# Patient Record
Sex: Female | Born: 1970
Health system: Southern US, Community
[De-identification: ages and names within clinical notes are randomized; demographics above are authoritative.]

## PROBLEM LIST (undated history)

## (undated) ENCOUNTER — Encounter

## (undated) ENCOUNTER — Telehealth

## (undated) ENCOUNTER — Ambulatory Visit: Payer: PRIVATE HEALTH INSURANCE

## (undated) ENCOUNTER — Encounter: Attending: Rheumatology | Primary: Rheumatology

## (undated) ENCOUNTER — Ambulatory Visit

## (undated) ENCOUNTER — Ambulatory Visit: Payer: PRIVATE HEALTH INSURANCE | Attending: Family | Primary: Family

## (undated) MED ORDER — GLUCOSAMINE SULFATE 500 MG TABLET: ORAL | 0 days

## (undated) MED ORDER — OMEGA 3-DHA-EPA-FISH OIL 300 MG-1,000 MG CAPSULE: Freq: Every day | ORAL | 0 days

## (undated) MED ORDER — COENZYME Q10 10 MG CAPSULE: Freq: Every day | ORAL | 0.00000 days

---

## 2017-12-30 MED ORDER — FLUTICASONE 250 MCG-SALMETEROL 50 MCG/DOSE BLISTR POWDR FOR INHALATION
Freq: Two times a day (BID) | RESPIRATORY_TRACT | 4 refills | 0 days
Start: 2017-12-30 — End: 2018-08-19

## 2018-02-09 MED ORDER — FOLIC ACID 1 MG TABLET
ORAL_TABLET | Freq: Every day | ORAL | 1 refills | 0 days
Start: 2018-02-09 — End: 2018-08-19

## 2018-06-18 ENCOUNTER — Other Ambulatory Visit: Admit: 2018-06-18 | Discharge: 2018-06-19

## 2018-06-18 DIAGNOSIS — R69 Illness, unspecified: Principal | ICD-10-CM

## 2018-06-26 MED ORDER — SERTRALINE 50 MG TABLET
ORAL_TABLET | Freq: Every day | ORAL | 1 refills | 0.00000 days
Start: 2018-06-26 — End: 2018-08-19

## 2018-06-26 MED ORDER — ALBUTEROL SULFATE HFA 90 MCG/ACTUATION AEROSOL INHALER
RESPIRATORY_TRACT | 2 refills | 0 days
Start: 2018-06-26 — End: 2018-08-19

## 2018-07-28 MED ORDER — METHOTREXATE SODIUM 2.5 MG TABLET
ORAL_TABLET | ORAL | 0 refills | 0 days
Start: 2018-07-28 — End: 2018-08-19

## 2018-08-18 MED ORDER — MONTELUKAST 10 MG TABLET
ORAL_TABLET | Freq: Every day | ORAL | 1 refills | 0 days
Start: 2018-08-18 — End: 2018-08-19

## 2018-08-19 ENCOUNTER — Ambulatory Visit: Admit: 2018-08-19 | Discharge: 2018-08-19 | Payer: PRIVATE HEALTH INSURANCE

## 2018-08-19 DIAGNOSIS — M3130 Wegener's granulomatosis without renal involvement: Secondary | ICD-10-CM

## 2018-08-19 DIAGNOSIS — B9689 Other specified bacterial agents as the cause of diseases classified elsewhere: Secondary | ICD-10-CM

## 2018-08-19 DIAGNOSIS — R3915 Urgency of urination: Secondary | ICD-10-CM

## 2018-08-19 DIAGNOSIS — N760 Acute vaginitis: Secondary | ICD-10-CM

## 2018-08-19 DIAGNOSIS — K219 Gastro-esophageal reflux disease without esophagitis: Secondary | ICD-10-CM

## 2018-08-19 DIAGNOSIS — Z0000 Encounter for general adult medical examination without abnormal findings: Principal | ICD-10-CM

## 2018-08-19 DIAGNOSIS — F330 Major depressive disorder, recurrent, mild: Secondary | ICD-10-CM

## 2018-08-19 DIAGNOSIS — F419 Anxiety disorder, unspecified: Secondary | ICD-10-CM

## 2018-08-19 DIAGNOSIS — H905 Unspecified sensorineural hearing loss: Secondary | ICD-10-CM

## 2018-08-19 DIAGNOSIS — J4520 Mild intermittent asthma, uncomplicated: Secondary | ICD-10-CM

## 2018-08-19 LAB — AST (SGOT): Aspartate aminotransferase:CCnc:Pt:Ser/Plas:Qn:: 25

## 2018-08-19 LAB — COMPREHENSIVE METABOLIC PANEL
ALBUMIN: 3.4 g/dL (ref 3.4–5.0)
ALKALINE PHOSPHATASE: 65 U/L (ref 46–116)
ALT (SGPT): 15 U/L (ref 7–40)
ANION GAP: 8 mmol/L (ref 7–24)
AST (SGOT): 25 U/L (ref 13–40)
BILIRUBIN TOTAL: 0.6 mg/dL (ref 0.3–1.2)
BLOOD UREA NITROGEN: 18 mg/dL (ref 9–23)
BUN / CREAT RATIO: 33
CALCIUM: 8.7 mg/dL (ref 8.7–10.4)
CHLORIDE: 104 mmol/L (ref 98–107)
CO2: 26.9 mmol/L (ref 20.0–31.0)
CREATININE: 0.54 mg/dL (ref 0.50–0.80)
EGFR CKD-EPI AA FEMALE: >90 mL/min/{1.73_m2} (ref >=60)
GLUCOSE RANDOM: 89 mg/dL (ref 70–99)
POTASSIUM: 4.5 mmol/L (ref 3.5–5.1)
PROTEIN TOTAL: 7.3 g/dL (ref 5.7–8.2)
SODIUM: 139 mmol/L (ref 136–145)

## 2018-08-19 LAB — CBC
HEMOGLOBIN: 12.8 g/dL (ref 12.0–15.5)
MEAN CORPUSCULAR HEMOGLOBIN CONC: 33.6 g/dL (ref 30.0–36.0)
MEAN CORPUSCULAR HEMOGLOBIN: 29.7 pg (ref 26.0–34.0)
MEAN CORPUSCULAR VOLUME: 88.3 fL (ref 82.0–98.0)
MEAN PLATELET VOLUME: 7.3 fL (ref 7.0–10.0)
PLATELET COUNT: 308 10*9/L (ref 150–450)
RED CELL DISTRIBUTION WIDTH: 17.3 % — ABNORMAL HIGH (ref 12.0–15.0)
WBC ADJUSTED: 7.7 10*9/L (ref 3.5–10.5)

## 2018-08-19 LAB — LIPID PANEL
CHOLESTEROL/HDL RATIO SCREEN: 2.8 (ref 1.0–4.5)
CHOLESTEROL: 252 mg/dL — ABNORMAL HIGH (ref <=200)
NON-HDL CHOLESTEROL: 162 mg/dL — ABNORMAL HIGH (ref 70–130)
TRIGLYCERIDES: 111 mg/dL (ref 0–150)
VLDL CHOLESTEROL CAL: 22.2 mg/dL (ref 9–37)

## 2018-08-19 LAB — HEMATOCRIT: Lab: 38.2

## 2018-08-19 LAB — TRIGLYCERIDES: Triglyceride:MCnc:Pt:Ser/Plas:Qn:: 111

## 2018-08-19 LAB — THYROID STIMULATING HORMONE: Chemistry studies:Cmplx:-:^Patient:Set:: 1.124

## 2018-08-19 MED ORDER — MONTELUKAST 10 MG TABLET
ORAL_TABLET | Freq: Every day | ORAL | 1 refills | 0 days | Status: CP
Start: 2018-08-19 — End: ?
  Filled 2018-08-20: qty 90, 90d supply, fill #0

## 2018-08-19 MED ORDER — ALBUTEROL SULFATE HFA 90 MCG/ACTUATION AEROSOL INHALER
RESPIRATORY_TRACT | 3 refills | 0 days | Status: CP
Start: 2018-08-19 — End: ?
  Filled 2018-10-26: qty 8.5, 25d supply, fill #0

## 2018-08-19 MED ORDER — FLUCONAZOLE 150 MG TABLET
ORAL_TABLET | Freq: Once | ORAL | 0 refills | 0 days | Status: CP
Start: 2018-08-19 — End: 2018-09-03
  Filled 2018-08-20: qty 2, 2d supply, fill #0

## 2018-08-19 MED ORDER — PHENAZOPYRIDINE 200 MG TABLET
ORAL_TABLET | Freq: Three times a day (TID) | ORAL | 0 refills | 0.00000 days | Status: CP | PRN
Start: 2018-08-19 — End: 2018-09-03
  Filled 2018-08-20: qty 15, 5d supply, fill #0

## 2018-08-19 MED ORDER — PANTOPRAZOLE 40 MG TABLET,DELAYED RELEASE
ORAL_TABLET | Freq: Every day | ORAL | 1 refills | 90 days | Status: CP
Start: 2018-08-19 — End: ?
  Filled 2018-08-21: qty 30, 30d supply, fill #0

## 2018-08-19 MED ORDER — FOLIC ACID 1 MG TABLET
ORAL_TABLET | Freq: Every day | ORAL | 1 refills | 90.00000 days | Status: CP
Start: 2018-08-19 — End: 2019-02-04
  Filled 2018-08-20: qty 90, 90d supply, fill #0

## 2018-08-19 MED ORDER — FLUTICASONE 250 MCG-SALMETEROL 50 MCG/DOSE BLISTR POWDR FOR INHALATION
Freq: Two times a day (BID) | RESPIRATORY_TRACT | 3 refills | 0 days | Status: CP
Start: 2018-08-19 — End: ?
  Filled 2018-10-26: qty 60, 30d supply, fill #0

## 2018-08-19 MED ORDER — METHOTREXATE SODIUM 2.5 MG TABLET
ORAL_TABLET | ORAL | 1 refills | 0 days | Status: CP
Start: 2018-08-19 — End: 2019-02-04
  Filled 2018-08-20: qty 96, 84d supply, fill #0

## 2018-08-19 MED ORDER — SERTRALINE 50 MG TABLET
ORAL_TABLET | Freq: Every day | ORAL | 1 refills | 0 days | Status: CP
Start: 2018-08-19 — End: 2018-12-14
  Filled 2018-08-20: qty 270, 90d supply, fill #0

## 2018-08-19 MED ORDER — METRONIDAZOLE 0.75 % VAGINAL GEL
0 refills | 0 days | Status: CP
Start: 2018-08-19 — End: ?
  Filled 2018-08-19: qty 70, 7d supply, fill #0

## 2018-08-19 MED FILL — METRONIDAZOLE 0.75 % VAGINAL GEL: 7 days supply | Qty: 70 | Fill #0 | Status: AC

## 2018-08-19 NOTE — Unmapped (Signed)
Assessment and Plan:     Routine general medical examination at a health care facility.  Age-appropriate counseling was performed today. Discussed healthy diet and regular physical activity. Immunizations up to date. Discussed all health maintenance topics today. Mammogram order placed.  Pap updated today. Appropriate referrals placed. F/u 1 year for next CPE. Will check screening labs as below and call patient with results and update treatment plan as needed.   - CBC; Future  - Comprehensive Metabolic Panel; Future  - Lipid Panel; Future  - TSH; Future    Urinary urgency/ Bacterial vaginosis.  Urinary dipstick negative for WBC, blood, nitrites. Will send urine for culture to confirm.  Wet prep in the office was positive for clue cells. KOH positive for yeast. Prescription for Metrogel one application nightly x7 nights as well as diflucan as below. Will start Diflucan 150 mg once then repeat in 3 days.  Recommended pyridium 200mg  tid x 5 days to help calm down inflammation in the bladder. Patient to increase fluids. Recommended daily probiotic and to increase fluid intake. Patient verbalized understanding. F/u if no improvement in 3-5 days.   - POCT urinalysis dipstick  - Urine culture; Future  - POCT KOH Exam  - POCT Wet Prep  - fluconazole (DIFLUCAN) 150 MG tablet. Take 1 tablet (150 mg total) by mouth once for 1 dose today and another one in 3 days.,  - metroNIDAZOLE (METROGEL) 0.75 % vaginal gel; Insert one applicator vaginally at night for 7 nights.    Anxiety/ Mild episode of recurrent major depressive disorder (CMS-HCC).  Depression at goal with present therapy of Zoloft 150 mg daily.  PHQ-9 at zero.  GAD-7 at zero. At this time, there is no need for referral to additional depression care-management support.  Continue current treatment plan.  Patient verbalized an understanding of today's recommendations. Refills given. F/u 6 months, sooner with any concerns.   - sertraline (ZOLOFT) 50 MG tablet; Take 3 tablets (150 mg total) by mouth daily.    Mild intermittent asthma without complication.  Symptoms well-controlled at present with daily Singulair and Advair inhalers. Patient has albuterol HFA inhaler for flares.  Patient states heat/summertime is when she struggles with asthma exacerbations.  Continue current treatment plan.  Refills given today.   - montelukast (SINGULAIR) 10 mg tablet; TAKE 1 TABLET BY MOUTH EVERY DAY  - fluticasone propion-salmeterol (ADVAIR DISKUS) 250-50 mcg/dose diskus; Inhale 1 puff Two (2) times a day.  - albuterol HFA 90 mcg/actuation inhaler; Inhale 2 puffs Every four (4) hours.    Gastroesophageal reflux disease, esophagitis presence not specified.  Symptoms well controlled on Protonix.  Continue current treatment plan.  Refills given today.    - pantoprazole (PROTONIX) 40 MG tablet; Take 1 tablet (40 mg total) by mouth daily.    Granulomatosis with polyangiitis, unspecified whether renal involvement (CMS-HCC).  Symptoms stable on methotrexate daily along with prednisone taper as needed.  Patient's previous rheumatologist is retiring and patient request new referral today; information for referral is placed on AVS. Continue current treatment plan.   - Ambulatory referral to Rheumatology; Future  - folic acid (FOLVITE) 1 MG tablet; Take 1 tablet (1 mg total) by mouth daily.  - methotrexate 2.5 MG tablet; Take 8 tablets (20 mg total) by mouth every seven (7) days.    Sensorineural hearing loss (SNHL) of right ear, unspecified hearing status on contralateral side.  Referral to ENT placed today; recommendations written on AVS.  - Ambulatory referral to ENT; Future  Medication adherence and barriers to the treatment plan have been addressed. Opportunities to optimize healthy behaviors have been discussed. Patient / caregiver voiced understanding.    Return in about 6 months (around 02/17/2019) for Recheck, Depression, Anxiety, Hyperlipidemia.    Subjective:   Alexis Jenkins is a 48 y.o. female who presents to establish care with chief complaint of urinary urgency.  Patient states she ran a urine dipstick last Thursday and it only showed trace blood at work.  Patient denies burning. Patient states it is more like a raking feeling when she urinates.  Patient states symptoms started last Wednesday.  Patient has had bladder issues since giving childbirth.  Patient has pain in her lower back and legs.  Patient denies vaginal discharge. Patient denies vaginal odor.  Patient denies pain with intercourse.      Past medical history significant for anxiety (Zoloft 150 mg), reflux (Protonix 40 mg daily), asthma (Advair daily, albuterol HFA inhaler, Singulair), and Wegener's (methorexate with folate and prednisone prn) and some dyslipidemia.     Past surgical history:  Partial hysterectomy with ovaries and cervix intact. Carpal tunnel release. Septum repair with rhinoplasty. Breast augmentation surgery.     Asthma:  Patient states this time of year her asthma is well controlled.  Patient is on Advair daily and has albuterol HFA as needed.  Patient states the warm weather/heat is her biggest trigger for asthma.      Anxiety:  PHQ-9 at zero. GAD-7 at zero. Patient states her mood is stable and has been able to decrease her Zoloft to 150 mg daily.  She denies current suicidal and homicidal ideation. She complains of the following side effects from the treatment: none.    Reflux:  Patient states she struggles with reflux symptoms and takes Protonix 40 mg on a daily basis.  Patient states her weight is up a bit and that has exacerbated her reflux symptoms.       Family History:  Positive for diabetes, hypertension, depression, anxiety disorder.   Daughter has CMT and myasthenia gravis.  Maternal grandmother with Alzheimer's. Maternal grandfather positive for lung and colon cancer.  Sister with anxiety and depression.      Allergies: Erythromycin (nausea or vomiting, nitrofurantoin (nausea or vomiting), Xanax (hypersensitivity).     Social History:  Cigarettes/smokeless tobacco: Never used.  Alcohol:  Social a couple of drinks per week.  Illicit drugs: Never used.    Health maintenance:  Tdap: up to date; due 07/01/2022.  Mammogram: Order placed today.  Last physical 11/2017. Pneumonia completed.  Last Pap 11/2017 at Dr. Tawanna Cooler Meissinger at  [facility],  [facility] under patient name Jamani Eley.      Lifestyle:  Diet:  Could be better.   Physical activity:  Recently joined the wellness center.   Sleep: 7-8 hours sound sleep.   Dental and Eye exams: Up to date.   Hearing:  Recommend referral to ENT.    Chief Complaint   Patient presents with   ??? Establish Care     w/ physical, urgency of holding water, ran UA last thursday and it only showed blood at work, raking feeling, not burning   ??? Other     last PAP was in June 2019 normal, repeat every 3 years     Social History:     Social History     Socioeconomic History   ??? Marital status: Married     Spouse name: Morton Peters Julien Girt)   ??? Number of children:  2   ??? Years of education: None   ??? Highest education level: None   Occupational History   ??? Occupation: Lab at [employer]   Social Needs   ??? Financial resource strain: None   ??? Food insecurity     Worry: None     Inability: None   ??? Transportation needs     Medical: None     Non-medical: None   Tobacco Use   ??? Smoking status: Never Smoker   ??? Smokeless tobacco: Never Used   Substance and Sexual Activity   ??? Alcohol use: Yes     Alcohol/week: 1.0 standard drinks     Types: 1 Glasses of wine per week     Frequency: 2-3 times a week     Drinks per session: 1 or 2   ??? Drug use: Never   ??? Sexual activity: Yes     Partners: Male     Birth control/protection: None   Lifestyle   ??? Physical activity     Days per week: None     Minutes per session: None   ??? Stress: None   Relationships   ??? Social Wellsite geologist on phone: None     Gets together: None     Attends religious service: None     Active member of club or organization: None     Attends meetings of clubs or organizations: None     Relationship status: None   Other Topics Concern   ??? None   Social History Narrative   ??? None     married    Past Medical/Surgical History:     Past Medical History:   Diagnosis Date   ??? Anxiety    ??? Asthma    ??? Depression    ??? GERD (gastroesophageal reflux disease)    ??? Hyperlipidemia      Past Surgical History:   Procedure Laterality Date   ??? breast lift  2010   ??? BREAST SURGERY      augmentation   ??? button in nose for septum  2019   ??? DNC  1991 & 2004   ??? HAND SURGERY  2011   ??? PARTIAL HYSTERECTOMY  2012   ??? RHINOPLASTY  2005       Family History:     Family History   Problem Relation Age of Onset   ??? Diabetes Mother         type 2   ??? Hypertension Mother    ??? Depression Mother    ??? Anxiety disorder Mother    ??? Hyperlipidemia Mother    ??? No Known Problems Father    ??? Anxiety disorder Sister    ??? Depression Sister    ??? Charcot-Marie-Tooth disease Daughter    ??? Myasthenia gravis Daughter    ??? Alzheimer's disease Maternal Grandmother    ??? Heart disease Maternal Grandfather    ??? Cancer Maternal Grandfather         lung cancer (smoker). colon cancer   ??? No Known Problems Paternal Grandmother    ??? No Known Problems Paternal Grandfather    ??? Substance Abuse Disorder Neg Hx        Allergies:     Erythromycin base; Nitrofurantoin macrocrystal; and Xanax [alprazolam]    Current Medications:     Current Outpatient Medications   Medication Sig Dispense Refill   ??? albuterol HFA 90 mcg/actuation inhaler Inhale 2 puffs Every four (4)  hours. 8.5 g 3   ??? fluticasone propion-salmeterol (ADVAIR DISKUS) 250-50 mcg/dose diskus Inhale 1 puff Two (2) times a day. 180 each 3   ??? folic acid (FOLVITE) 1 MG tablet Take 1 tablet (1 mg total) by mouth daily. 90 tablet 1   ??? methotrexate 2.5 MG tablet Take 8 tablets (20 mg total) by mouth every seven (7) days. 160 tablet 1   ??? montelukast (SINGULAIR) 10 mg tablet TAKE 1 TABLET BY MOUTH EVERY DAY 90 tablet 1   ??? pantoprazole (PROTONIX) 40 MG tablet Take 1 tablet (40 mg total) by mouth daily. 90 tablet 1   ??? predniSONE (DELTASONE) 5 MG tablet Take 5 mg by mouth once as needed.     ??? sertraline (ZOLOFT) 50 MG tablet Take 3 tablets (150 mg total) by mouth daily. 270 tablet 1     No current facility-administered medications for this visit.        Health Maintenance:       PREVENTIVE SERVICES SUMMARY:    -     -  Colorectal Cancer Screening:    Colonoscopy date: Not Found - Patient is low risk; Screening starts at 50 years  -  Breast Cancer Screening:    Mammogram date: 05/25/2014 - Discussed at this visit; order placed today.   -  Cervical Cancer Screening:    Pap Smear date: Not Found    Done at this visit  -  Mammogram: ordered  -  Pap: done at this visit.     Immunizations:     Immunization History   Administered Date(s) Administered   ??? INFLUENZA TIV (TRI) 66MO+ W/ PRESERV (IM) 04/01/2015   ??? Influenza Vaccine Quad (IIV4 PF) 25mo+ injectable 05/01/2014, 03/17/2015   ??? Influenza Virus Vaccine, unspecified formulation 03/23/2016, 03/28/2017, 03/22/2018   ??? PNEUMOCOCCAL POLYSACCHARIDE 23 07/03/2014   ??? Pneumococcal Conjugate 13-Valent 12/12/2017   ??? TdaP 07/01/2012   ??? Tetanus and diptheria,(adult), adsorbed, 2Lf tetanus toxoid, PF 03/27/2005       I have reviewed and (if needed) updated the patient's problem list, medications, allergies, past medical and surgical history, preventive services, and social and family history.    ROS:     A 12 point review of systems was negative except for pertinent items noted in the HPI.    Vital Signs:     Wt Readings from Last 3 Encounters:   08/19/18 76.1 kg (167 lb 12.8 oz)     Temp Readings from Last 3 Encounters:   08/19/18 36.8 ??C (98.3 ??F)     BP Readings from Last 3 Encounters:   08/19/18 110/76     Pulse Readings from Last 3 Encounters:   08/19/18 83     Estimated body mass index is 29.73 kg/m?? as calculated from the following:    Height as of this encounter: 160 cm (5' 2.99). Weight as of this encounter: 76.1 kg (167 lb 12.8 oz).  Facility age limit for growth percentiles is 20 years.        Objective:     General Appearance: Alert, cooperative, no distress, appears stated age.   Head and Neck: Normocephalic, atraumatic. No  masses. No thyromegaly. No bruits.   EENT: Eyes: PERRLA, Conjuntiva clear; Ears: bilateral TMs normal; Nose: septum and turbinates unremarkable; Throat: mucus membranes moist. No tonsilar enlargement or exudates. No OP erythema.  Cardiovascular:  Regular rate and rhythm. No murmurs  Respiratory: Lungs clear to auscultation bilaterally,  Respiratory effort unremarkable. No wheezes or rhonchi.  Breast: Symmetrical. No masses or lumps. No nipple discharge.  No abnormal axillary nodes  Gastrointestinal: Abdomen soft and non-tender. No organomegaly or masses.  Genitourinary: Normal female external genitalia. Urethra non-tender and without masses, lesions. Vagina normal in appearance, without discharge, lesions. No significant prolapse. Cervix os identified without discharge. Uterus mobile, normal in size and non-tender. Bimanual exam revealed no masses or cervical motion tenderness. Pap obtained.  Vaginal swabs for KOH and wet prep obtained.  Musculoskeletal: Gait and station unremarkable. Normal ROM. Normal strength and tone.   Extremities :No edema.  Peripheral pulses normal  Integumentary: Warm and dry.No rashes.  No abnormal appearing skin lesions  LYMPH NODES: Cervical, supraclavicular, and axillary nodes normal  Neurologic: Alert and oriented to place and time. Normal deep tendon reflexes. CN II-XII grossly intact.   Psychiatric: Mood and affect appropriate for situation.     I attest that I, Geralyn Mora Appl, personally documented this note while acting as scribe for [employer], [employer].      Geralyn A Mazzotta, Scribe.  08/19/2018     The documentation recorded by the scribe accurately reflects the service I personally performed and the decisions made by me. Gabriel Rainwater, PA

## 2018-08-19 NOTE — Unmapped (Signed)
[facility] Specialty Medication Referral: Financial Assistance Unavailable      Medication (Brand/Generic): Methotrexate    Final Test Claim completed with resulted information below:    Patient ABLE to fill at [facility]  Insurance Company:  AHP  Anticipated Copay: $68.43  Is anticipated copay with a copay card or grant? No  Co-Pay Card program: No Co-Pay card available   Kennedy Bucker Funding: No grant available   MFR Assistance: No program available      Does patient's insurance plan only allow a 15 day supply for the first 6 fills in the [facility] Program? No  If yes, inform patient they can request to dis-enroll from the [facility] by calling the patient help desk at N/A.      If the copay is under the $25 defined limit, per policy there will be no further investigation of need for financial assistance at this time unless patient requests. This referral has been communicated to the provider and handed off to the [facility] team for further processing and filling of prescribed medication.   ______________________________________________________________________  Please utilize this referral for viewing purposes as it will serve as the central location for all relevant documentation and updates.

## 2018-08-19 NOTE — Unmapped (Signed)
Addended by: Threasa Beards on: 08/19/2018 10:32 AM     Modules accepted: Orders

## 2018-08-19 NOTE — Unmapped (Addendum)
error 

## 2018-08-19 NOTE — Unmapped (Addendum)
Per test claims for Methotrexate and Metronidazole at the [facility], patient needs Medication Assistance Program for High Copays.    Per test claim for Pantoprazole at the [facility], patient needs Medication Assistance Program for Prior Authorization.

## 2018-08-19 NOTE — Unmapped (Addendum)
Patient Education      ENT REFERRAL:  [facility] WITH DR. Natale Milch AND DR. Yetta Barre.     RHEUMATOLOGY REFERRAL:  [facility] OFF OF [address], [place].      Urge Incontinence in Women: Care Instructions  Your Care Instructions    Urge incontinence occurs when the need to urinate is so strong that you cannot reach the toilet in time, even when your bladder contains only a small amount of urine. This is also called overactive bladder or unstable bladder. Some women may have no warning before they leak urine. This condition does not cause major health problems. But it can be embarrassing and can affect a woman's self-esteem and confidence.  Treatment can cure or improve your symptoms.  Follow-up care is a key part of your treatment and safety. Be sure to make and go to all appointments, and call your doctor if you are having problems. It's also a good idea to know your test results and keep a list of the medicines you take.  How can you care for yourself at home?  ?? Be safe with medicines. Take your medicines exactly as prescribed. Call your doctor if you think you are having a problem with your medicine. You will get more details on the specific medicines your doctor prescribes.  ?? Limit caffeine and alcohol. They stimulate urine production.  ?? Urinate every 2 to 4 hours during waking hours, even if you feel that you do not have to go.  ?? Do pelvic floor (Kegel) exercises, which tighten and strengthen pelvic muscles. To do Kegel exercises:  ? Squeeze the same muscles you would use to stop your urine. Your belly and thighs should not move.  ? Hold the squeeze for 3 seconds, then relax for 3 seconds.  ? Start with 3 seconds. Then add 1 second each week until you are able to squeeze for 10 seconds.  ? Repeat the exercise 10 to 15 times for each session. Do three or more sessions each day.  ?? Try wearing pads that absorb the leaks.  ?? Keep skin in the genital area dry.  When should you call for help?  Call your doctor now or seek immediate medical care if:  ?? ?? You have new urinary symptoms. These may include leaking urine, having pain when urinating, or feeling like you need to urinate often.   ??Watch closely for changes in your health, and be sure to contact your doctor if:  ?? ?? You do not get better as expected.   Where can you learn more?  Go to [facility] at https://myuncchart.org  [facility] [employer] [facility] under [employer]. Enter [phone] in the search box to learn more about Urge Incontinence in Women: Care Instructions.  Current as of: August 19, 2017  Content Version: 12.3  ?? 2006-2019 [employer] Inc[employer]. Care instructions adapted under license by [facility]. If you have questions about a medical condition or this instruction, always ask your healthcare professional. Healthwise, Incorporated disclaims any warranty or liability for your use of this information.       Patient Education        Well Visit, Ages 36 to 73: Care Instructions  Your Care Instructions    Physical exams can help you stay healthy. Your doctor has checked your overall health and may have suggested ways to take good care of yourself. He or she also may have recommended tests. At home, you can help prevent illness with healthy eating, regular  exercise, and other steps.  Follow-up care is a key part of your treatment and safety. Be sure to make and go to all appointments, and call your doctor if you are having problems. It's also a good idea to know your test results and keep a list of the medicines you take.  How can you care for yourself at home?  ?? Reach and stay at a healthy weight. This will lower your risk for many problems, such as obesity, diabetes, heart disease, and high blood pressure.  ?? Get at least 30 minutes of physical activity on most days of the week. Walking is a good choice. You also may want to do other activities, such as running, swimming, cycling, or playing tennis or team sports. Discuss any changes in your exercise program with your doctor.  ?? Do not smoke or allow others to smoke around you. If you need help quitting, talk to your doctor about stop-smoking programs and medicines. These can increase your chances of quitting for good.  ?? Talk to your doctor about whether you have any risk factors for sexually transmitted infections (STIs). Having one sex partner (who does not have STIs and does not have sex with anyone else) is a good way to avoid these infections.  ?? Use birth control if you do not want to have children at this time. Talk with your doctor about the choices available and what might be best for you.  ?? Protect your skin from too much sun. When you're outdoors from 10 a.m. to 4 p.m., stay in the shade or cover up with clothing and a hat with a wide brim. Wear sunglasses that block UV rays. Even when it's cloudy, put broad-spectrum sunscreen (SPF 30 or higher) on any exposed skin.  ?? See a dentist one or two times a year for checkups and to have your teeth cleaned.  ?? Wear a seat belt in the car.  Follow your doctor's advice about when to have certain tests. These tests can spot problems early.  For everyone  ?? Cholesterol. Have the fat (cholesterol) in your blood tested after age 58. Your doctor will tell you how often to have this done based on your age, family history, or other things that can increase your risk for heart disease.  ?? Blood pressure. Have your blood pressure checked during a routine doctor visit. Your doctor will tell you how often to check your blood pressure based on your age, your blood pressure results, and other factors.  ?? Vision. Talk with your doctor about how often to have a glaucoma test.  ?? Diabetes. Ask your doctor whether you should have tests for diabetes.  ?? Colon cancer. Your risk for colorectal cancer gets higher as you get older. Some experts say that adults should start regular screening at age 43 and stop at age 4. Others say to start before age 68 or continue after age 34. Talk with your doctor about your risk and when to start and stop screening.  For women  ?? Breast exam and mammogram. Talk to your doctor about when you should have a clinical breast exam and a mammogram. Medical experts differ on whether and how often women under 50 should have these tests. Your doctor can help you decide what is right for you.  ?? Cervical cancer screening test and pelvic exam. Begin with a Pap test at age 9. The test often is part of a pelvic exam. Starting at age 33, you may choose  to have a Pap test, an HPV test, or both tests at the same time (called co-testing). Talk with your doctor about how often to have testing.  ?? Tests for sexually transmitted infections (STIs). Ask whether you should have tests for STIs. You may be at risk if you have sex with more than one person, especially if your partners do not wear condoms.  For men  ?? Tests for sexually transmitted infections (STIs). Ask whether you should have tests for STIs. You may be at risk if you have sex with more than one person, especially if you do not wear a condom.  ?? Testicular cancer exam. Ask your doctor whether you should check your testicles regularly.  ?? Prostate exam. Talk to your doctor about whether you should have a blood test (called a PSA test) for prostate cancer. Experts differ on whether and when men should have this test. Some experts suggest it if you are older than 56 and are African-American or have a father or brother who got prostate cancer when he was younger than 36.  When should you call for help?  Watch closely for changes in your health, and be sure to contact your doctor if you have any problems or symptoms that concern you.  Where can you learn more?  Go to [facility] at https://myuncchart.org  [employer] Health Library under [employer]. Enter P072 in the search box to learn more about Well Visit, Ages 59 to 54: Care Instructions.  Current as of: February 18, 2018  Content Version: 12.3  ?? 2006-2019 Healthwise, Inc[employer]. Care instructions adapted under license by [facility]. If you have questions about a medical condition or this instruction, always ask your healthcare professional. Healthwise, Incorporated disclaims any warranty or liability for your use of this information.

## 2018-08-20 MED FILL — METHOTREXATE SODIUM 2.5 MG TABLET: 84 days supply | Qty: 96 | Fill #0 | Status: AC

## 2018-08-20 MED FILL — FLUCONAZOLE 150 MG TABLET: 2 days supply | Qty: 2 | Fill #0 | Status: AC

## 2018-08-20 MED FILL — FOLIC ACID 1 MG TABLET: 90 days supply | Qty: 90 | Fill #0 | Status: AC

## 2018-08-20 MED FILL — PHENAZOPYRIDINE 200 MG TABLET: 5 days supply | Qty: 15 | Fill #0 | Status: AC

## 2018-08-20 MED FILL — MONTELUKAST 10 MG TABLET: 90 days supply | Qty: 90 | Fill #0 | Status: AC

## 2018-08-20 MED FILL — SERTRALINE 50 MG TABLET: 90 days supply | Qty: 270 | Fill #0 | Status: AC

## 2018-08-20 NOTE — Unmapped (Signed)
Called patient left a detailed vm about the   Pantoprazole. She can purchase it over the counter for about $20 for 90 days.

## 2018-08-20 NOTE — Unmapped (Signed)
-----   Message from [facility] [place], [place] sent at 08/20/2018  9:44 AM EST -----  Can you please look into this for me?  ----- Message -----  From: Arlan Organ, PharmD  Sent: 08/20/2018   9:22 AM EST  To: Ian Bushman, PA    Anya,  Emmily's pantoprazole requires a prior authorization. I wasn't sure if anybody reached out to you already from the shared service center. If not, you can find the form for pantoprazole (and any other medications requiring prior authorization under the [facility] plan) at [email or web address]    Thanks!    [facility] at [facility]

## 2018-08-21 MED FILL — PANTOPRAZOLE 40 MG TABLET,DELAYED RELEASE: 30 days supply | Qty: 30 | Fill #0 | Status: AC

## 2018-09-03 ENCOUNTER — Ambulatory Visit: Admit: 2018-09-03 | Discharge: 2018-09-04 | Payer: PRIVATE HEALTH INSURANCE

## 2018-09-03 DIAGNOSIS — F329 Major depressive disorder, single episode, unspecified: Principal | ICD-10-CM

## 2018-09-03 DIAGNOSIS — K219 Gastro-esophageal reflux disease without esophagitis: Principal | ICD-10-CM

## 2018-09-03 DIAGNOSIS — J45909 Unspecified asthma, uncomplicated: Principal | ICD-10-CM

## 2018-09-03 DIAGNOSIS — E785 Hyperlipidemia, unspecified: Principal | ICD-10-CM

## 2018-09-03 DIAGNOSIS — H905 Unspecified sensorineural hearing loss: Principal | ICD-10-CM

## 2018-09-03 DIAGNOSIS — J329 Chronic sinusitis, unspecified: Principal | ICD-10-CM

## 2018-09-03 DIAGNOSIS — J3489 Other specified disorders of nose and nasal sinuses: Principal | ICD-10-CM

## 2018-09-03 DIAGNOSIS — F419 Anxiety disorder, unspecified: Principal | ICD-10-CM

## 2018-09-04 NOTE — Unmapped (Signed)
Assessment/Plan:        Diagnoses and all orders for this visit:    Asymmetrical sensorineural hearing loss  AUDIOMETRIC TESTING: Audiometric testing was performed today and revealed a bilateral Sensorineural hearing loss worse on the left with intact speech discrimination scores. These findings were reviewed with the patient/parent.  Consider hearing aids.  The patient will sign a release that we can get her records from her prior evaluation and compare her current audiogram to previous.  Nasal septal perforation    Chronic sinusitis, unspecified location  Consider removal of the septal button since she has not had any issues with epistaxis in a considerable amount of time.          Subjective:      HPI  Alexis Jenkins is a 48 y.o. old female who presents for evaluation of hearing loss.  She has had issues primarily in the left ear for years.  She underwent an evaluation at [facility].  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  It began after an ear infection in 2015.  It has become increasingly difficult for her to hear and understand conversation particularly in background noise.  She has frequent sinus infections.  She has been treated multiple times since July.  She had a septal button placed for perforation years ago.    The following portions of the patient's history were reviewed and updated as appropriate:   She  has a past medical history of Anxiety, Asthma, Depression, GERD (gastroesophageal reflux disease), and Hyperlipidemia.  She  has a past surgical history that includes [facility] (1991 & 2004); Hand surgery (2011); Rhinoplasty (2005); breast lift (2010); Partial hysterectomy (2012); button in nose for septum (2019); and Breast surgery.  Her family history includes Alzheimer's disease in her maternal grandmother; Anxiety disorder in her mother and sister; Cancer in her maternal grandfather; Charcot-Marie-Tooth disease in her daughter; Depression in her mother and sister; Diabetes in her mother; Heart disease in her maternal grandfather; Hyperlipidemia in her mother; Hypertension in her mother; Myasthenia gravis in her daughter; No Known Problems in her father, paternal grandfather, and paternal grandmother.  She  reports that she has never smoked. She has never used smokeless tobacco. She reports current alcohol use of about 1.0 standard drinks of alcohol per week. She reports that she does not use drugs.  Current Outpatient Medications   Medication Sig Dispense Refill   ??? albuterol HFA 90 mcg/actuation inhaler Inhale 2 puffs Every four (4) hours. 8.5 g 3   ??? fluticasone propion-salmeterol (ADVAIR DISKUS) 250-50 mcg/dose diskus Inhale 1 puff Two (2) times a day. 180 each 3   ??? folic acid (FOLVITE) 1 MG tablet Take 1 tablet (1 mg total) by mouth daily. 90 tablet 1   ??? methotrexate 2.5 MG tablet Take 8 tablets (20 mg total) by mouth every seven (7) days. 160 tablet 1   ??? metroNIDAZOLE (METROGEL) 0.75 % vaginal gel Insert one applicator vaginally at night for 7 nights. 70 g 0   ??? montelukast (SINGULAIR) 10 mg tablet TAKE 1 TABLET BY MOUTH EVERY DAY 90 tablet 1   ??? pantoprazole (PROTONIX) 40 MG tablet Take 1 tablet (40 mg total) by mouth daily. 90 tablet 1   ??? sertraline (ZOLOFT) 50 MG tablet Take 3 tablets (150 mg total) by mouth daily. 270 tablet 1     No current facility-administered medications for this visit.      She is allergic to erythromycin base; nitrofurantoin macrocrystal;  and xanax [alprazolam]..    Review of Systems  ENT ROS as noted in HPI  Fever--NO  Skin rash--NO  Chest pain--NO  Nausea/vomiting--NO  Easy bleeding--NO  Eating/swallowing problems--NO  Weight changes--NO  Headache--yes  Ear drainage--NO  Shortness of breath--NO  Cough--NO  Urinary problems--NO    Objective:      Physical Exam  Constitutional      General appearance: Appears stated age, normocephalic and atraumatic. Vocalization and voice quality normal for age.    Ears, Nose, Mouth, and Throat External inspection of ears: Normal overall appearance without lesions or inflammation bilaterally.     External inspection of the nose: symmetric and atraumatic.     Otoscopic examination: External auditory canal without inflammation, discharge, lesions,or masses bilaterally. Middle Ear without effusion, mass, or cholesteatoma bilaterally. Tympanic membranes intact without retractions or lesions bilaterally.     Hearing:  Clinical speech reception within normal limits.    Nasal mucosa: Normal pink color without polyps identified bilaterally. Septum: Septal button in nasal septal perforation with moderate crusting adjacent. Turbinates: Inferior - normal size and color bilaterally.     Oral Cavity:  Lips, teeth, and gums: Upper and Lower lips with normal color, moist and no cracks or lesions. Dentition in good repair. Gingiva: normal color without lesions.   Hard Palate: intact without clefting and no lesions. Soft Palate: Intact, normal mucosal color. Tongue: tongue has normal mobility, color and appearance. Oral mucosa: buccal, labial, lingual, palatal mucosa pink, well hydrated without lesions or masses. Oropharynx: pharyngeal mucosa without inflamation, without lesions or masses. Tonsils: tonsils not enlarged or inflamed bilaterally.Tongue base/lingual tonsils are without inflammation or masses. Floor of mouth: normal course of Wharton's duct, no masses.      Submandibular Glands: normal bilaterally. Parotid Glands: normal size, symmetric, no lesions palpated bilaterally. Sublingual salivary glands: normal.          Neck     Neck: Normal appearance without obvious scarring or asymmetry.   Thyroid: Thyroid not enlarged, no palpable nodules or tenderness.     Lymphatic     Palpation of lymph nodes in head and neck: Left: Normal. Right: Normal.     Neurologic   Cranial nerves: Cranial nerves intact with no focal neurological deficits. Normal ocular motility, primary gaze and normal facial strength.     Psychiatric Judgment and insight: Normal for age.   Alert and oriented as appropriate for age.      Mood and affect: Normal for age.             I attest to the above information and documentation. However, this note has been created using voice recognition software and may have errors that were not dictated and not seen in editing.

## 2018-09-04 NOTE — Unmapped (Signed)
AUDIOLOGIC EVALUATION    Joud Pettinato presents to the [facility] audiology department for an evaluation. She is seen in conjunction with Dr. Natale Milch.      HISTORY: Shainna Faux is a 48 y.o. female who presents for evaluation of hearing loss. She reports a hearing loss in her left ear for more than 5 years, and she was told it is due to Wegener's disease.    Otoscopy revealed clear ear canals bilaterally. Tympanometry was administered today to assess middle ear status. Results are as indicated below for today???s immittance testing.  BILATERAL: Type A, consistent with normal middle ear pressure, width, and admittance    RESULTS: Today's results were obtained using inserts with good reliability.   PURE-TONE FINDINGS  RIGHT ear: Mild sensorineural hearing loss.  LEFT ear: Mild sloping to moderately-severe sensorineural hearing loss.    Speech reception thresholds are in agreement with pure-tone findings, bilaterally.     RIGHT ear: Word recognition is excellent (100%) when speech is presented at a average conversational level (55 dB).   LEFT ear: Word recognition is good (88%) when speech is presented at a loud level (70 dB) with masking.       * * * SEE MEDIA FOR FULL AUDIOGRAM * * *    RECOMMENDATIONS:  ??? ENT - patient is seeing Dr. Natale Milch today.  ??? Annual hearing evaluation  ??? Further testing per ENT to investigate left asymmetrical hearing loss.  ??? Consider trial with amplification pending medical clearance    Deidre Ala, Au.D., CCC-A  Clinical Audiologist

## 2018-09-25 MED FILL — PANTOPRAZOLE 40 MG TABLET,DELAYED RELEASE: ORAL | 30 days supply | Qty: 30 | Fill #1

## 2018-09-25 MED FILL — PANTOPRAZOLE 40 MG TABLET,DELAYED RELEASE: 30 days supply | Qty: 30 | Fill #1 | Status: AC

## 2018-09-25 NOTE — Unmapped (Signed)
Patient will be scheduled with Casimiro Needle today for UTI as she has not established with our practice.

## 2018-10-26 MED FILL — ADVAIR DISKUS 250 MCG-50 MCG/DOSE POWDER FOR INHALATION: 30 days supply | Qty: 60 | Fill #0 | Status: AC

## 2018-10-26 MED FILL — PROAIR HFA 90 MCG/ACTUATION AEROSOL INHALER: 25 days supply | Qty: 8 | Fill #0 | Status: AC

## 2018-11-13 MED FILL — PANTOPRAZOLE 40 MG TABLET,DELAYED RELEASE: ORAL | 90 days supply | Qty: 90 | Fill #0

## 2018-11-13 MED FILL — PANTOPRAZOLE 40 MG TABLET,DELAYED RELEASE: 90 days supply | Qty: 90 | Fill #0 | Status: AC

## 2018-11-13 MED FILL — METHOTREXATE SODIUM 2.5 MG TABLET: 90 days supply | Qty: 110 | Fill #0 | Status: AC

## 2018-11-13 MED FILL — METHOTREXATE SODIUM 2.5 MG TABLET: ORAL | 90 days supply | Qty: 110 | Fill #0

## 2018-12-03 MED FILL — MONTELUKAST 10 MG TABLET: 90 days supply | Qty: 90 | Fill #0 | Status: AC

## 2018-12-03 MED FILL — MONTELUKAST 10 MG TABLET: ORAL | 90 days supply | Qty: 90 | Fill #0

## 2018-12-15 MED ORDER — SERTRALINE 50 MG TABLET
ORAL_TABLET | Freq: Every day | ORAL | 1 refills | 0 days | Status: CP
Start: 2018-12-15 — End: ?
  Filled 2018-12-18: qty 270, 90d supply, fill #0

## 2018-12-15 NOTE — Unmapped (Signed)
Requested refill sent to listed pharmacy.

## 2018-12-15 NOTE — Unmapped (Signed)
Patient is requesting the following refill  Requested Prescriptions     Pending Prescriptions Disp Refills   ??? sertraline (ZOLOFT) 50 MG tablet 270 tablet 1     Sig: Take 3 tablets (150 mg total) by mouth daily.       Last refill given on: 08-19-18 with 270 count and 0 refills.     Last OV: 08/19/2018, Appt was type was annual exam  Next OV: 02/17/2019. Scheduled for follow up appointment.

## 2018-12-18 MED FILL — SERTRALINE 50 MG TABLET: 90 days supply | Qty: 270 | Fill #0 | Status: AC

## 2019-01-12 MED FILL — FOLIC ACID 1 MG TABLET: 90 days supply | Qty: 90 | Fill #1 | Status: AC

## 2019-01-12 MED FILL — FOLIC ACID 1 MG TABLET: ORAL | 90 days supply | Qty: 90 | Fill #1

## 2019-02-04 ENCOUNTER — Ambulatory Visit: Admit: 2019-02-04 | Discharge: 2019-02-05 | Payer: PRIVATE HEALTH INSURANCE

## 2019-02-04 ENCOUNTER — Telehealth
Admit: 2019-02-04 | Discharge: 2019-02-05 | Payer: PRIVATE HEALTH INSURANCE | Attending: Rheumatology | Primary: Rheumatology

## 2019-02-04 DIAGNOSIS — M2550 Pain in unspecified joint: Secondary | ICD-10-CM

## 2019-02-04 DIAGNOSIS — M3130 Wegener's granulomatosis without renal involvement: Secondary | ICD-10-CM

## 2019-02-04 LAB — CBC W/ AUTO DIFF
BASOPHILS ABSOLUTE COUNT: 0 10*9/L (ref 0.0–0.1)
BASOPHILS RELATIVE PERCENT: 0.4 %
EOSINOPHILS ABSOLUTE COUNT: 0.2 10*9/L (ref 0.0–0.7)
EOSINOPHILS RELATIVE PERCENT: 2.7 %
HEMATOCRIT: 35.7 % (ref 35.0–44.0)
HEMOGLOBIN: 12.2 g/dL (ref 12.0–15.5)
LYMPHOCYTES ABSOLUTE COUNT: 1.6 10*9/L (ref 0.7–4.0)
LYMPHOCYTES RELATIVE PERCENT: 24 %
MEAN CORPUSCULAR VOLUME: 89.9 fL (ref 82.0–98.0)
MEAN PLATELET VOLUME: 6.9 fL — ABNORMAL LOW (ref 7.0–10.0)
MONOCYTES ABSOLUTE COUNT: 0.4 10*9/L (ref 0.1–1.0)
MONOCYTES RELATIVE PERCENT: 5.9 %
NEUTROPHILS RELATIVE PERCENT: 67 %
PLATELET COUNT: 261 10*9/L (ref 150–450)
RED BLOOD CELL COUNT: 3.97 10*12/L (ref 3.90–5.03)
RED CELL DISTRIBUTION WIDTH: 16.2 % — ABNORMAL HIGH (ref 12.0–15.0)
WBC ADJUSTED: 6.7 10*9/L (ref 3.5–10.5)

## 2019-02-04 LAB — COMPREHENSIVE METABOLIC PANEL
ALBUMIN: 3.7 g/dL (ref 3.4–5.0)
ALKALINE PHOSPHATASE: 66 U/L (ref 46–116)
ALT (SGPT): 10 U/L (ref 10–49)
AST (SGOT): 23 U/L (ref <34)
BILIRUBIN TOTAL: 0.8 mg/dL (ref 0.3–1.2)
BLOOD UREA NITROGEN: 11 mg/dL (ref 9–23)
BUN / CREAT RATIO: 21
CALCIUM: 9.1 mg/dL (ref 8.7–10.4)
CO2: 23 mmol/L (ref 20.0–31.0)
CREATININE: 0.53 mg/dL (ref 0.50–0.80)
EGFR CKD-EPI AA FEMALE: >90 mL/min/{1.73_m2}
EGFR CKD-EPI NON-AA FEMALE: >90 mL/min/{1.73_m2}
POTASSIUM: 4.1 mmol/L (ref 3.5–5.1)
PROTEIN TOTAL: 6.3 g/dL (ref 5.7–8.2)
SODIUM: 139 mmol/L (ref 136–145)

## 2019-02-04 LAB — MONOCYTES RELATIVE PERCENT: Lab: 5.9

## 2019-02-04 LAB — EGFR CKD-EPI AA FEMALE: Lab: >90

## 2019-02-04 MED ORDER — FOLIC ACID 1 MG TABLET
ORAL_TABLET | Freq: Every day | ORAL | 3 refills | 90 days | Status: CP
Start: 2019-02-04 — End: ?

## 2019-02-04 MED ORDER — METHOTREXATE SODIUM 2.5 MG TABLET
ORAL_TABLET | ORAL | 3 refills | 84.00000 days | Status: CP
Start: 2019-02-04 — End: ?
  Filled 2019-02-19: qty 96, 84d supply, fill #0

## 2019-02-04 NOTE — Unmapped (Signed)
RHEUMATOLOGY NEW CLINIC NOTE--VIDEO    Assessment/Plan:   Alexis Jenkins is a Caucasian  48 y.o. female with limited GPA (+ANA, -ANCA), characterized by hearing loss, nose bleed 2/2 septum perforation, and asthma.    Granulomatosis with polyangiitis, unspecified whether renal involvement (CMS-HCC) (M31.30)     Limited Granulomatosis with polyangiitis (+ANA, -ANCA):  Diagnosed 2013. Symptoms includes nose bleed from nasal septum perforation, hearing loss, and asthma. Currently on MTX 20 mg PO with good control of disease activity. For nasal symptoms, she is followed by ENT for treatment options.  - Continue MTX 20 mg PO weekly and folic acid (refill ordered).  - MTX labs (CBC, ALT, AST, Cr). Last labs 08/2018 were wnl.  - UA at least annually, recently normal    Hearing loss:  No hearing function on the L and some impairment on the R. She is followed by ENT to discuss placement of hearing aids.    Arthralgia:  Joint pain involving wrist, knee, feet, hips over the past 3 years. Symptoms sound more inflammatory in nature (prolonged morning stiffness, swelling). If indeed inflammatory arthritis, this could be from GPA, and MTX would be a first line for treating arthritis symptoms. Since she is on PO MTX, could consider switching to injection to increase bioavailability.     The hip pain could also be from OA, trochanteric bursitis, or less likely avascular necrosis (unlikely given limited prednisone use). Will obtain x-ray to characterize the joint disease.   - x-ray of hand, feet, and hips to be done at [facility]  - Consider switching MTX from PO to [facility] if evidence of inflammatory arthritis on imaging or if sx are worsening    Watery diarrhea:  Reports intermittent episodes of watery diarrhea that has been a chronic issue and not related to MTX. Most likely IBS. Will monitor symptoms, and if symptoms seem to be worsen by MTX, will switch from PO to [place] to bypass GI.    Orders Placed This Encounter   Procedures   ??? XR Hand 2 Views Bilateral   ??? XR Foot 3 Or More Views Bilateral   ??? XR Pelvis 1 Or 2 Views   ??? CBC w/ Differential   ??? Comprehensive Metabolic Panel       Medications Prescribed Today             folic acid (FOLVITE) 1 MG tablet Take 1 tablet (1 mg total) by mouth daily.    methotrexate 2.5 MG tablet Take 8 tablets (20 mg total) by mouth every seven (7) days.           Return in about 6 months (around 08/07/2019).    We appreciate the opportunity to participate in the care of this patient. Total duration of the visit was 32 minutes , and > than 50% of the visit is spent in face-to-face counselling focusing on diagnosis and therapeutic options.       History of Present Illness:     The patient was seen to establish care for limited GPA following the retirement of her prior rheumatologist (Dr. Freida Busman at [facility]).    Primary Care Provider: Gabriel Rainwater, PA    Chief Complaint: establish care for GPA on MTX    HPI:  Alexis Jenkins is a 48 y.o.  female with hx of limited GPA (+ANA, -ANCA), characterized by hearing loss, nose bleed 2/2 septum perforation, and asthma who was referred to [facility] rheumatology as a new patient. She was previously followed by Dr.  Freida Busman at [facility], who recently retired.    The patient was initially diagnosed with GPA in 2013. She initially presented with frequent sinus infections, hearing loss, nasal septum perforation, rashes, and asthma attack. There was no kidney involvement. Since being diagnosed, she has been taking MTX 20 mg weekly without side effects. She was on prednisone previously, but discontinued due to side effects including weight loss, sleep issue, and irritability. Over the past couple years, her GPA is well controlled and rarely flares up, maybe a few times a year. The flares are triggered by stress or other illness. The last flare was in 05/2018, with symptoms of nose bleeding, sinusitis, and rash (hands and face). The flares usually gets better with prednisone. She also reports occasional pain oral sores in the mouth.    Regarding hearing loss, patient reports that she continues to have no hearing function on the left. She has some hearing impairment on the right. She has been working with ENT on possibly getting hearing aids.    She experiences daily nose bleeding from the nasal septum perforation, 2-3 times per day. It bleeds profusely for a short while but is easy to stop. ENT previously put a button in the hole of the nasal septum in 12/2017, which worked well in terms of stopping bleeding, but the button frequently dislodged into the sinus. Therefore, they removed the button, and patient does not want another one treatment at this moment. She has been followed by ENT regarding her nasal septum and sinusitis issues. She uses neti pot once in a while for symptom relief. She denies anemia issue from recurrent bleeding and reports her Hgb was fine. She has not had any recent infection since 05/2018.    Regarding asthma, she uses Singulair and Advair with good control of asthma symptoms. She uses albuterol occasional, more often in the summer. She would need albuterol for a few days, and then not using it for months.    Patient endorses joint pain in wrist, knee, hips, feet for the past 3 years. The pain is worse in the morning. Morning stiffness lasts for couple hours, which is relieved by ibuprofen. She reports some swelling in ankles and wrist. She reports the hip pain is new and located in the hip socket. The hip pain is especially worse when sleeping.    She endorses watery diarrhea on some days, 4-5 times a day on the days of having the diarrhea. She reports this has been a life-long issue and not related to initiating MTX. Rarely seen mucus in the stool. No bloody, tarry stool, or tenesmus.    Allergies:  Erythromycin base, Nitrofurantoin macrocrystal, and Xanax [alprazolam]    Medications:   Outpatient Medications Prior to Visit   Medication Sig Dispense Refill   ??? albuterol HFA 90 mcg/actuation inhaler Inhale 2 puffs Every four (4) hours. 8.5 g 3   ??? fluticasone propion-salmeteroL (ADVAIR DISKUS) 250-50 mcg/dose diskus Inhale 1 puff Two (2) times a day. 180 each 3   ??? metroNIDAZOLE (METROGEL) 0.75 % vaginal gel Insert one applicator vaginally at night for 7 nights. 70 g 0   ??? montelukast (SINGULAIR) 10 mg tablet TAKE 1 TABLET BY MOUTH EVERY DAY 90 tablet 1   ??? pantoprazole (PROTONIX) 40 MG tablet Take 1 tablet (40 mg total) by mouth daily. 90 tablet 1   ??? sertraline (ZOLOFT) 50 MG tablet Take 3 tablets (150 mg total) by mouth daily. 270 tablet 1   ??? folic acid (FOLVITE) 1 MG  tablet Take 1 tablet (1 mg total) by mouth daily. 90 tablet 1   ??? methotrexate 2.5 MG tablet Take 8 tablets (20 mg total) by mouth every seven (7) days. 160 tablet 1     No facility-administered medications prior to visit.        Medical History:  Past Medical History:   Diagnosis Date   ??? Anxiety    ??? Asthma    ??? Depression    ??? GERD (gastroesophageal reflux disease)    ??? Hyperlipidemia        Surgical History:  Past Surgical History:   Procedure Laterality Date   ??? breast lift  2010   ??? BREAST SURGERY      augmentation   ??? button in nose for septum  2019   ??? DNC  1991 & 2004   ??? HAND SURGERY  2011   ??? PARTIAL HYSTERECTOMY  2012   ??? RHINOPLASTY  2005       Social History:  Social History     Tobacco Use   ??? Smoking status: Never Smoker   ??? Smokeless tobacco: Never Used   Substance Use Topics   ??? Alcohol use: Yes     Alcohol/week: 1.0 standard drinks     Types: 1 Glasses of wine per week     Frequency: 2-3 times a week     Drinks per session: 1 or 2   ??? Drug use: Never   She is a Holiday representative. Lives with husband and daughter, and a dog. EtOH occasional, denies smoking or drug.    Family History:  Family History   Problem Relation Age of Onset   ??? Diabetes Mother         type 2   ??? Hypertension Mother    ??? Depression Mother    ??? Anxiety disorder Mother    ??? Hyperlipidemia Mother    ??? No Known Problems Father    ??? Anxiety disorder Sister    ??? Depression Sister    ??? Charcot-Marie-Tooth disease Daughter    ??? Myasthenia gravis Daughter    ??? Alzheimer's disease Maternal Grandmother    ??? Heart disease Maternal Grandfather    ??? Cancer Maternal Grandfather         lung cancer (smoker). colon cancer   ??? No Known Problems Paternal Grandmother    ??? No Known Problems Paternal Grandfather    ??? Substance Abuse Disorder Neg Hx        Review of Systems: Please see above in the HPI, the remainder of the ROS was unremarkable. Denies new rash, SOB, chest pain, cough, hemoptysis, hematuria, foamy, abadominal pain, fever, chills, headache, or vision change.    Review of outside records: I have reviewed available records through Epic. Pertinent results include:   ANCA negative (12/12/2017)  ANA 1:640 (11/30/2013)      Objective   Physical Exam (limited 2/2 video modality)  General:   Well appearing female in no acute distress   Eyes:   PERRL, EOMI, conjunctivae are clear.   ENT:   MMM. Oropharhynx without any erythema or exudate.   Neck:   Supple, full ROM   Lungs:   Normal work of breathing.   Skin:    No rash.   Psychiatry:   Alert and oriented to person, place, and time   Musculo Skeletal:   No joint tenderness, deformity, effusions. Full range of motion in shoulder, elbow, hands .  Test Results  No visits with results within 4 Week(s) from this visit.   Latest known visit with results is:   Appointment on 08/19/2018   Component Date Value   ??? WBC 08/19/2018 7.7    ??? RBC 08/19/2018 4.33    ??? HGB 08/19/2018 12.8    ??? HCT 08/19/2018 38.2    ??? MCV 08/19/2018 88.3    ??? [facility] [place] [facility] 08/19/2018 29.7    ??? MCHC 08/19/2018 33.6    ??? RDW 08/19/2018 17.3*   ??? MPV 08/19/2018 7.3    ??? Platelet 08/19/2018 308    ??? Sodium 08/19/2018 139    ??? Potassium 08/19/2018 4.5    ??? Chloride 08/19/2018 104    ??? Anion Gap 08/19/2018 8    ??? CO2 08/19/2018 26.9    ??? BUN 08/19/2018 18    ??? Creatinine 08/19/2018 0.54    ??? BUN/Creatinine Ratio 08/19/2018 33    ??? EGFR CKD-EPI Non-African* 08/19/2018 >90    ??? EGFR CKD-EPI African Ame* 08/19/2018 >90    ??? Glucose 08/19/2018 89    ??? Calcium 08/19/2018 8.7    ??? Albumin 08/19/2018 3.4    ??? Total Protein 08/19/2018 7.3    ??? Total Bilirubin 08/19/2018 0.6    ??? AST 08/19/2018 25    ??? ALT 08/19/2018 15    ??? Alkaline Phosphatase 08/19/2018 65    ??? Triglycerides 08/19/2018 111    ??? Cholesterol 08/19/2018 252*   ??? HDL 08/19/2018 90*   ??? LDL Calculated 08/19/2018 161*   ??? VLDL Cholesterol Cal 08/19/2018 22.2    ??? Chol/HDL Ratio 08/19/2018 2.8    ??? Non-HDL Cholesterol 08/19/2018 162*   ??? FASTING 08/19/2018 Yes    ??? TSH 08/19/2018 1.124      Kaidan Spengler Danelle Earthly, MS4

## 2019-02-12 ENCOUNTER — Ambulatory Visit: Admit: 2019-02-12 | Discharge: 2019-02-13 | Payer: PRIVATE HEALTH INSURANCE

## 2019-02-12 DIAGNOSIS — M3130 Wegener's granulomatosis without renal involvement: Principal | ICD-10-CM

## 2019-02-12 DIAGNOSIS — M2550 Pain in unspecified joint: Secondary | ICD-10-CM

## 2019-02-14 ENCOUNTER — Ambulatory Visit: Admit: 2019-02-14 | Discharge: 2019-02-15 | Payer: PRIVATE HEALTH INSURANCE

## 2019-02-19 MED FILL — PANTOPRAZOLE 40 MG TABLET,DELAYED RELEASE: ORAL | 30 days supply | Qty: 30 | Fill #0

## 2019-02-19 MED FILL — PANTOPRAZOLE 40 MG TABLET,DELAYED RELEASE: 30 days supply | Qty: 30 | Fill #0 | Status: AC

## 2019-02-19 MED FILL — METHOTREXATE SODIUM 2.5 MG TABLET: 84 days supply | Qty: 96 | Fill #0 | Status: AC

## 2019-03-04 ENCOUNTER — Ambulatory Visit: Admit: 2019-03-04 | Discharge: 2019-03-05 | Payer: PRIVATE HEALTH INSURANCE

## 2019-03-04 DIAGNOSIS — Z1239 Encounter for other screening for malignant neoplasm of breast: Secondary | ICD-10-CM

## 2019-03-05 ENCOUNTER — Ambulatory Visit: Admit: 2019-03-05 | Discharge: 2019-03-06 | Payer: PRIVATE HEALTH INSURANCE

## 2019-03-05 DIAGNOSIS — H1033 Unspecified acute conjunctivitis, bilateral: Secondary | ICD-10-CM

## 2019-03-05 MED ORDER — MOXIFLOXACIN 0.5 % VISCOUS EYE DROPS
0 refills | 0 days | Status: CP
Start: 2019-03-05 — End: ?
  Filled 2019-03-05: qty 3, 7d supply, fill #0

## 2019-03-05 MED FILL — MOXIFLOXACIN 0.5 % VISCOUS EYE DROPS: 7 days supply | Qty: 3 | Fill #0 | Status: AC

## 2019-03-05 NOTE — Unmapped (Signed)
Name:  Alexis Jenkins  DOB: 18-Jun-1971  Today's Date: 03/05/2019   Age:  48 y.o.    A/P:  Acute bacterial conjunctivitis of both eyes.  Prescription for moxifloxacin eye drops as below.  Instructed patient on the contagious nature of conjunctivitis and to avoid patient contact for the next 24 hours.  Keep towels separate and avoid touching eye to surfaces.  Counseled patient that she would no longer be contagious after being on eyedrops x24 hours.  Patient verbalized understanding and was in agreement with plan.    - moxifloxacin (MOXEZA) 0.5 % DrpV; Instill 1 drop in both eyes twice daily for 7 days.    Preventive services addressed today  Preventive services are currently up to date    Medication adherence and barriers to the treatment plan have been addressed. Opportunities to optimize healthy behaviors have been discussed. Patient / caregiver voiced understanding.    Return if symptoms worsen or fail to improve.      S:  Alexis Jenkins is a 48 y.o. female who presents with chief complaint of itchy eye, red eye, and swollen eyelid on the right. Patient has had drainage with eyes matted shut in the morning both today and yesterday.  Patient states her nose has been running all week. Symptoms started on Wednesday.     ROS:  A 12 point review of systems was negative except for pertinent items noted in the HPI.    Past medical history, family history, surgical history and social history personally reviewed and verified with patient.     PROBLEM LIST  Patient Active Problem List   Diagnosis   ??? Anxiety   ??? Asthma   ??? Depression   ??? GERD (gastroesophageal reflux disease)   ??? Granulomatosis with polyangiitis of nose (CMS-HCC)   ??? Wegener's disease, pulmonary (CMS-HCC)   ??? Nasal septal perforation   ??? Positive ANA (antinuclear antibody)   ??? Sensorineural hearing loss of right ear       O:  Vitals:    03/05/19 1148   BP: 116/86   Pulse: 86   Resp: 18   Temp: 36.6 ??C (97.9 ??F)   SpO2: 99%     General appearance: No acute distress, well appearing and well nourished.   HEENT: Normocephalic, atraumatic. Right eye with erythema, edema and purulent drainage. Conjunctiva normal, lids w/o swelling, erythema or discharge. TMs gray with intact cone of light. Nasal mucosa, septum, and turbinates w/o edema or erythema. OP normal with no erythema, edema, exudate or lesions.   NECK: Supple, symmetric, trachea midline, no masses. No lymphadenopathy. No thyromegaly or masses.  HEART: RRR w/ normal S1 and S2. No MRG. DP and PT pulses  2+ bilaterally. No peripheral edema or varicosities.  LUNGS: No signs of respiratory distress. CTA all fields. No wheezes, rales, rhonchi or crackles.     I attest that I, Geralyn Mora Appl, personally documented this note while acting as scribe for [employer], PA.      Geralyn A Mazzotta, Scribe.  03/05/2019     The documentation recorded by the scribe accurately reflects the service I personally performed and the decisions made by me.    Gabriel Rainwater, PA

## 2019-03-05 NOTE — Unmapped (Addendum)
Patient Education     Instructed patient on contagious nature. Once you are on antibiotics x 24 hours you will no longer be contagious.      Do not touch the dropper to your eye when giving yourself the eyedrops.    Pinkeye: Care Instructions  Your Care Instructions     Pinkeye is redness and swelling of the eye surface and the conjunctiva (the lining of the eyelid and the covering of the white part of the eye). Pinkeye is also called conjunctivitis. Pinkeye is often caused by infection with bacteria or a virus. Dry air, allergies, smoke, and chemicals are other common causes.  Pinkeye often clears on its own in 7 to 10 days. Antibiotics only help if the pinkeye is caused by bacteria. Pinkeye caused by infection spreads easily. If an allergy or chemical is causing pinkeye, it will not go away unless you can avoid whatever is causing it.  Follow-up care is a key part of your treatment and safety. Be sure to make and go to all appointments, and call your doctor if you are having problems. It's also a good idea to know your test results and keep a list of the medicines you take.  How can you care for yourself at home?  ?? Wash your hands often. Always wash them before and after you treat pinkeye or touch your eyes or face.  ?? Use moist cotton or a clean, wet cloth to remove crust. Wipe from the inside corner of the eye to the outside. Use a clean part of the cloth for each wipe.  ?? Put cold or warm wet cloths on your eye a few times a day if the eye hurts.  ?? Do not wear contact lenses or eye makeup until the pinkeye is gone. Throw away any eye makeup you were using when you got pinkeye. Clean your contacts and storage case. If you wear disposable contacts, use a new pair when your eye has cleared and it is safe to wear contacts again.  ?? If the doctor gave you antibiotic ointment or eyedrops, use them as directed. Use the medicine for as long as instructed, even if your eye starts looking better soon. Keep the bottle tip clean, and do not let it touch the eye area.  ?? To put in eyedrops or ointment:  ? Tilt your head back, and pull your lower eyelid down with one finger.  ? Drop or squirt the medicine inside the lower lid.  ? Close your eye for 30 to 60 seconds to let the drops or ointment move around.  ? Do not touch the ointment or dropper tip to your eyelashes or any other surface.  ?? Do not share towels, pillows, or washcloths while you have pinkeye.  When should you call for help?   Call your doctor now or seek immediate medical care if:  ?? ?? You have pain in your eye, not just irritation on the surface.   ?? ?? You have a change in vision or loss of vision.   ?? ?? You have an increase in discharge from the eye.   ?? ?? Your eye has not started to improve or begins to get worse within 48 hours after you start using antibiotics.   ?? ?? Pinkeye lasts longer than 7 days.   Watch closely for changes in your health, and be sure to contact your doctor if you have any problems.  Where can you learn more?  Go to [facility]  at https://myuncchart.org  Select Patient Education under [employer]. Enter Y392 in the search box to learn more about Pinkeye: Care Instructions.  Current as of: December 24, 2017??????????????????????????????Content Version: 12.6  ?? 2006-2020 [employer].   Care instructions adapted under license by [facility]. If you have questions about a medical condition or this instruction, always ask your healthcare professional. Healthwise, Incorporated disclaims any warranty or liability for your use of this information.

## 2019-03-09 ENCOUNTER — Ambulatory Visit: Admit: 2019-03-09 | Discharge: 2019-03-09 | Payer: PRIVATE HEALTH INSURANCE

## 2019-03-09 ENCOUNTER — Institutional Professional Consult (permissible substitution): Admit: 2019-03-09 | Discharge: 2019-03-09 | Payer: PRIVATE HEALTH INSURANCE

## 2019-03-09 DIAGNOSIS — E785 Hyperlipidemia, unspecified: Secondary | ICD-10-CM

## 2019-03-09 DIAGNOSIS — N912 Amenorrhea, unspecified: Secondary | ICD-10-CM

## 2019-03-09 DIAGNOSIS — R5383 Other fatigue: Secondary | ICD-10-CM

## 2019-03-09 DIAGNOSIS — K219 Gastro-esophageal reflux disease without esophagitis: Secondary | ICD-10-CM

## 2019-03-09 DIAGNOSIS — R635 Abnormal weight gain: Secondary | ICD-10-CM

## 2019-03-09 DIAGNOSIS — F330 Major depressive disorder, recurrent, mild: Secondary | ICD-10-CM

## 2019-03-09 DIAGNOSIS — F419 Anxiety disorder, unspecified: Secondary | ICD-10-CM

## 2019-03-09 DIAGNOSIS — J4520 Mild intermittent asthma, uncomplicated: Secondary | ICD-10-CM

## 2019-03-09 DIAGNOSIS — H1033 Unspecified acute conjunctivitis, bilateral: Secondary | ICD-10-CM

## 2019-03-09 LAB — THYROID STIMULATING HORMONE: Chemistry studies:Cmplx:-:^Patient:Set:: 1.373

## 2019-03-09 LAB — LIPID PANEL
CHOLESTEROL/HDL RATIO SCREEN: 3.4 (ref 1.0–4.5)
CHOLESTEROL: 255 mg/dL — ABNORMAL HIGH (ref <=200)
LDL CHOLESTEROL CALCULATED: 124 mg/dL — ABNORMAL HIGH (ref 40–100)
TRIGLYCERIDES: 281 mg/dL — ABNORMAL HIGH (ref 0–150)
VLDL CHOLESTEROL CAL: 56.2 mg/dL — ABNORMAL HIGH (ref 9–37)

## 2019-03-09 LAB — CBC W/ AUTO DIFF
BASOPHILS ABSOLUTE COUNT: 0 10*9/L (ref 0.0–0.1)
BASOPHILS RELATIVE PERCENT: 0.3 %
EOSINOPHILS ABSOLUTE COUNT: 0.3 10*9/L (ref 0.0–0.7)
HEMATOCRIT: 35.9 % (ref 35.0–44.0)
HEMOGLOBIN: 12.4 g/dL (ref 12.0–15.5)
LYMPHOCYTES ABSOLUTE COUNT: 1.8 10*9/L (ref 0.7–4.0)
LYMPHOCYTES RELATIVE PERCENT: 25.3 %
MEAN CORPUSCULAR HEMOGLOBIN CONC: 34.4 g/dL (ref 30.0–36.0)
MEAN CORPUSCULAR HEMOGLOBIN: 31.1 pg (ref 26.0–34.0)
MEAN PLATELET VOLUME: 6.8 fL — ABNORMAL LOW (ref 7.0–10.0)
MONOCYTES ABSOLUTE COUNT: 0.5 10*9/L (ref 0.1–1.0)
MONOCYTES RELATIVE PERCENT: 6.5 %
NEUTROPHILS ABSOLUTE COUNT: 4.7 10*9/L (ref 1.7–7.7)
NEUTROPHILS RELATIVE PERCENT: 64.2 %
PLATELET COUNT: 316 10*9/L (ref 150–450)
RED BLOOD CELL COUNT: 3.97 10*12/L (ref 3.90–5.03)
RED CELL DISTRIBUTION WIDTH: 15.8 % — ABNORMAL HIGH (ref 12.0–15.0)
WBC ADJUSTED: 7.2 10*9/L (ref 3.5–10.5)

## 2019-03-09 LAB — RED BLOOD CELL COUNT: Lab: 3.97

## 2019-03-09 LAB — COMPREHENSIVE METABOLIC PANEL
ALBUMIN: 3.5 g/dL (ref 3.4–5.0)
ALKALINE PHOSPHATASE: 71 U/L (ref 46–116)
ALT (SGPT): 16 U/L (ref 7–40)
ANION GAP: 8 mmol/L (ref 7–24)
AST (SGOT): 20 U/L (ref 13–40)
BILIRUBIN TOTAL: 0.6 mg/dL (ref 0.3–1.2)
BUN / CREAT RATIO: 18
CALCIUM: 8.6 mg/dL — ABNORMAL LOW (ref 8.7–10.4)
CHLORIDE: 103 mmol/L (ref 98–107)
CREATININE: 0.65 mg/dL (ref 0.50–0.80)
EGFR CKD-EPI AA FEMALE: >90 mL/min/{1.73_m2} (ref >=60)
EGFR CKD-EPI NON-AA FEMALE: >90 mL/min/{1.73_m2} (ref >=60)
GLUCOSE RANDOM: 82 mg/dL (ref 70–99)
POTASSIUM: 3.9 mmol/L (ref 3.5–5.1)
PROTEIN TOTAL: 7.1 g/dL (ref 5.7–8.2)

## 2019-03-09 LAB — VITAMIN B-12: Cobalamins:MCnc:Pt:Ser/Plas:Qn:: 618

## 2019-03-09 LAB — FERRITIN: Chemistry studies:Cmplx:-:^Patient:Set:: 17

## 2019-03-09 LAB — FOLLICLE STIMULATING HORMONE: Chemistry studies:Cmplx:-:^Patient:Set:: 8.8

## 2019-03-09 LAB — FREE T4: Chemistry studies:Cmplx:-:^Patient:Set:: 1.11

## 2019-03-09 LAB — ESTRADIOL LEVEL: Estradiol:MCnc:Pt:Ser/Plas:Qn:: 211.7

## 2019-03-09 LAB — T3 FREE: Chemistry studies:Cmplx:-:^Patient:Set:: 3.3

## 2019-03-09 LAB — GLUCOSE RANDOM: Glucose:MCnc:Pt:Ser/Plas:Qn:: 82

## 2019-03-09 LAB — LUTEINIZING HORMONE: Chemistry studies:Cmplx:-:^Patient:Set:: 6.7

## 2019-03-09 LAB — IRON: Chemistry studies:Cmplx:-:^Patient:Set:: 80

## 2019-03-09 LAB — IRON & TIBC: IRON: 80 ug/dL (ref 50–170)

## 2019-03-09 LAB — LDL CHOLESTEROL CALCULATED: Cholesterol.in LDL:MCnc:Pt:Ser/Plas:Qn:Calculated: 124 — ABNORMAL HIGH

## 2019-03-09 MED ORDER — MONTELUKAST 10 MG TABLET
ORAL_TABLET | Freq: Every day | ORAL | 3 refills | 90 days | Status: CP
Start: 2019-03-09 — End: ?
  Filled 2019-03-11: qty 90, 90d supply, fill #0

## 2019-03-09 MED ORDER — PANTOPRAZOLE 40 MG TABLET,DELAYED RELEASE
ORAL_TABLET | Freq: Every day | ORAL | 3 refills | 90 days | Status: CP
Start: 2019-03-09 — End: ?
  Filled 2019-03-11: qty 90, 90d supply, fill #0

## 2019-03-09 MED ORDER — FLUTICASONE 250 MCG-SALMETEROL 50 MCG/DOSE BLISTR POWDR FOR INHALATION
Freq: Two times a day (BID) | RESPIRATORY_TRACT | 3 refills | 90.00000 days | Status: CP
Start: 2019-03-09 — End: ?
  Filled 2019-03-11: qty 180, 90d supply, fill #0

## 2019-03-09 NOTE — Unmapped (Signed)
TeleHealth Phone Encounter  This medical encounter was conducted virtually using [email or web address]  TeleHealth protocols.    Patient ID: Alexis Jenkins is a 48 y.o. female who presents by telephone interaction for evaluation of follow up conjunctivitis, 6 month follow up of depression/anxiety, GERD, dyslipidemia.     Present on Telephone Call: Anson Fret and Monica Martinez    Assessment/Plan:      Diagnoses and all orders for this visit:    Acute bacterial conjunctivitis of both eyes: Conjunctivitis appears to be improving.  Patient will continue moxifloxacin drops twice daily for a total of 7 days.  She can continue using warm compresses.  Follow-up with any worsening of symptoms.    Mild episode of recurrent major depressive disorder (CMS-HCC)/ Anxiety: Depression and anxiety at goal with present therapy.  At this time, there is no need for referral to additional depression care-management support.  Continue current treatment plan.  Patient verbalized an understanding of today's recommendations. Refills given. F/u 6 months, sooner with any concerns.     Mild intermittent asthma without complication: Asthma has been stable.  She will continue Singulair and Advair.  She has not needed to use her albuterol inhaler recently.  Refills given.  -     montelukast (SINGULAIR) 10 mg tablet; TAKE 1 TABLET BY MOUTH EVERY DAY  -     fluticasone propion-salmeteroL (ADVAIR DISKUS) 250-50 mcg/dose diskus; Inhale 1 puff Two (2) times a day.    Gastroesophageal reflux disease, esophagitis presence not specified: Her reflux is also stable.  Continue Protonix 40 mg daily.  Refills given.  -     pantoprazole (PROTONIX) 40 MG tablet; Take 1 tablet (40 mg total) by mouth daily.    Dyslipidemia: Recommended patient continue low carbohydrate low sugar diet.  Will check lipid panel and CMP today.  I will let her know when results have returned and will adjust treatment as needed.  Patient can also try a fish oil supplement.  - Comprehensive Metabolic Panel; Future  -     Lipid Panel; Future    Weight gain and fatigue: Suspect that this is likely secondary to menopause as well as lack of activity and increase in carbs over COVID.  Will check labs as per below.  Recommended regular physical activity as well as low carbohydrate low sugar diet.  Patient is in agreement.  -     TSH; Future  -     T3, Free; Future  -     T4, Free; Future  -     Thyroglobulin Antibody; Future  -     Thyroid Peroxidase Antibody; Future  -     CBC w/ Differential; Future  -     TSH; Future  -     T3, Free; Future  -     T4, Free; Future  -     Thyroglobulin Antibody; Future  -     Thyroid Peroxidase Antibody; Future  -     Ferritin; Future  -     Iron & TIBC; Future  -     Vitamin B12 Level; Future  -     Vitamin D 25 Hydroxy (25OH D2 + D3); Future    Amenorrhea: Check patient's hormone levels to confirm menopause.  -     Estradiol (Estrogen) Level; Future  -     Follicle Stimulating Hormone Level [facility]); Future  -     Luteinizing hormone; Future    Preventive services addressed today  Influenza: patient will get  today at the [facility] pharmacy  Mammogram: patient had on friday. Results pending    -- Discussed the new prescription noted above, including potential side effects, drug interactions, instructions for taking the medication, and the consequences of not taking it.  -- Lifestyle changes:  work on improving diet (food selection, portion control) and increase daily exercise  -- Patient verbalized an understanding of today's assessment and recommendations, as well as the purpose of ongoing medications.    Return in about 6 months (around 09/06/2019) for Annual physical, with fasting labs.    Visit Time: 22 minutes    Medication adherence and barriers to the treatment plan have been addressed. Opportunities to optimize healthy behaviors have been discussed. Patient / caregiver voiced understanding.        Subjective:     HPI    Patient was seen in the office on Friday for conjunctivitis and started on moxifloxacin eyedrops.  She reports significant improvement in her symptoms.  She thinks that this originally began due to an allergic reaction to the glue in her fake eyelashes.  She says that the purulent drainage has stopped and she is no longer having any significant itching or irritation.    Depression/Anxiety   Alexis Jenkins is a 48 y.o. female who presents for follow up of depression. Current symptoms include none. Patient is doing well. Has no symptoms of depression currently. . Symptoms have been stable  since that time. Patient denies anhedonia, depressed mood, difficulty concentrating, feelings of worthlessness/guilt, hopelessness and hypersomnia. Previous treatment includes: medication. She complains of the following side effects from the treatment: none. Sheis not doing regular exercise.Is down to Zoloft 125mg  daily and doing well.     Hyperlipidemia   Alexis Jenkins is here for follow up of dyslipidemia.  Compliance with treatment has been good. The patient exercises rarely. She is not currently taking any medication for her cholesterol.     Lab Review  Lab Results   Component Value Date    Cholesterol 252 (H) 08/19/2018    Triglycerides 111 08/19/2018    HDL 90 (H) 08/19/2018    LDL Calculated 140 (H) 08/19/2018      Patient reports that she has had some issues with weight gain and fatigue.  She feels like this is likely linked to menopause but would like to have a general vitamin and mineral panel drawn as well as hormone levels and thyroid check.  Patient has not had a menstrual cycle in quite a number of years as she had a hysterectomy.  Does occasionally have hot flashes.    ROS  As per HPI.    Outpatient Medications Prior to Visit   Medication Sig Dispense Refill   ??? albuterol HFA 90 mcg/actuation inhaler Inhale 2 puffs Every four (4) hours. (Patient not taking: Reported on 03/05/2019) 8.5 g 3   ??? folic acid (FOLVITE) 1 MG tablet Take 1 tablet (1 mg total) by mouth daily. 90 tablet 3   ??? methotrexate 2.5 MG tablet Take 8 tablets (20 mg total) by mouth every seven (7) days. 96 tablet 3   ??? moxifloxacin (MOXEZA) 0.5 % DrpV Instill 1 drop in both eyes twice daily for 7 days. 3 mL 0   ??? sertraline (ZOLOFT) 50 MG tablet Take 3 tablets (150 mg total) by mouth daily. 270 tablet 1   ??? fluticasone propion-salmeteroL (ADVAIR DISKUS) 250-50 mcg/dose diskus Inhale 1 puff Two (2) times a day. 180 each 3   ??? metroNIDAZOLE (  METROGEL) 0.75 % vaginal gel Insert one applicator vaginally at night for 7 nights. (Patient not taking: Reported on 03/05/2019) 70 g 0   ??? montelukast (SINGULAIR) 10 mg tablet TAKE 1 TABLET BY MOUTH EVERY DAY 90 tablet 1   ??? pantoprazole (PROTONIX) 40 MG tablet Take 1 tablet (40 mg total) by mouth daily. 90 tablet 1     No facility-administered medications prior to visit.        The following portions of the patient's history were reviewed and updated as appropriate: allergies, current medications, past medical history, past social history and problem list.          Objective:     As part of this Telephone Visit, no in-person exam was conducted.  Telephone interaction permitted the following observations.    General: Conversed easily on telephone with good verbal feedback confirming comprehension.  RESP: relaxed respiratory effort  PSYCH: Alert and oriented.  Speech fluent and sensible.  Calm affect.         Note - This chart has been prepared using the Dragon voice recognition system. Typographical errors may have occurred.  Attempts have been made to correct errors, however, inadvertent errors may persist.     I spent 22 minutes on the phone with the patient. I spent an additional 5 minutes on pre- and post-visit activities.     The patient was physically located in [place] or a state in which I am permitted to provide care. The patient and/or parent/guardian understood that s/he may incur co-pays and cost sharing, and agreed to the telemedicine visit. The visit was reasonable and appropriate under the circumstances given the patient's presentation at the time.    The patient and/or parent/guardian has been advised of the potential risks and limitations of this mode of treatment (including, but not limited to, the absence of in-person examination) and has agreed to be treated using telemedicine. The patient's/patient's family's questions regarding telemedicine have been answered.     If the visit was completed in an ambulatory setting, the patient and/or parent/guardian has also been advised to contact their provider???s office for worsening conditions, and seek emergency medical treatment and/or call 911 if the patient deems either necessary.

## 2019-03-09 NOTE — Unmapped (Signed)
High Cholesterol: Care Instructions        Cholesterol is a type of fat in your blood. It is needed for many body functions, such as making new cells. Cholesterol is made by your body. It also comes from food you eat. High cholesterol means that you have too much of the fat in your blood. This raises your risk of a heart attack and stroke.    LDL and HDL are part of your total cholesterol. LDL is the bad cholesterol. High LDL can raise your risk for heart disease, heart attack, and stroke. HDL is the good cholesterol. It helps clear bad cholesterol from the body. High HDL is linked with a lower risk of heart disease, heart attack, and stroke.    Your cholesterol levels help your doctor find out your risk for having a heart attack or stroke. You and your doctor can talk about whether you need to lower your risk and what treatment is best for you.    A heart-healthy lifestyle along with medicines can help lower your cholesterol and your risk. The way you choose to lower your risk will depend on how high your risk is for heart attack and stroke. It will also depend on how you feel about taking medicines.    Follow-up care is a key part of your treatment and safety. Be sure to make and go to all appointments, and call your doctor if you are having problems. It's also a good idea to know your test results and keep a list of the medicines you take.    How can you care for yourself at home?  ?? Eat a variety of foods every day. Good choices include fruits, vegetables, whole grains (like oatmeal), dried beans and peas, nuts and seeds, soy products (like tofu), and fat-free or low-fat dairy products.  ?? Replace butter, margarine, and hydrogenated or partially hydrogenated oils with olive and canola oils. (Canola oil margarine without trans fat is fine.)  ?? Replace red meat with fish, poultry, and soy protein (like tofu).  ?? Limit processed and packaged foods like chips, crackers, and cookies.  ?? Bake, broil, or steam foods. Don't fry them.  ?? Be physically active. Get at least 30 minutes of exercise on most days of the week. Walking is a good choice. You also may want to do other activities, such as running, swimming, cycling, or playing tennis or team sports.  ?? Stay at a healthy weight or lose weight by making the changes in eating and physical activity listed above. Losing just a small amount of weight, even 5 to 10 pounds, can reduce your risk for having a heart attack or stroke.  ?? Do not smoke.    When should you call for help?  Watch closely for changes in your health, and be sure to contact your doctor if:    ?? You need help making lifestyle changes.     ?? You have questions about your medicine.     Where can you learn more?  Go to [facility] at [email or web address]  Select Preferences in the upper right hand corner, then select Health Library under Resources. Enter [phone] in the search box to learn more about High Cholesterol: Care Instructions.      Current as of: January 19, 2017  Content Version: 12.0  ?? 2006-2019 Healthwise, Incorporated. Care instructions adapted under license by [facility]. If you have questions about a medical condition or this instruction, always ask your healthcare professional. [employer]  [employer] disclaims any warranty or liability for your use of this information.      Anxiety Disorder: Care Instructions  Your Care Instructions    Anxiety is a normal reaction to stress. Difficult situations can cause you to have symptoms such as sweaty palms and a nervous feeling.  In an anxiety disorder, the symptoms are far more severe. Constant worry, muscle tension, trouble sleeping, nausea and diarrhea, and other symptoms can make normal daily activities difficult or impossible. These symptoms may occur for no reason, and they can affect your work, school, or social life. Medicines, counseling, and self-care can all help.    Follow-up care is a key part of your treatment and safety.   Be sure to make and go to all appointments, and call your doctor if you are having problems. It's also a good idea to know your test results and keep a list of the medicines you take.    How can you care for yourself at home?  ?? Take medicines exactly as directed. Call your doctor if you think you are having a problem with your medicine.  ?? Go to your counseling sessions and follow-up appointments.  ?? Recognize and accept your anxiety. Then, when you are in a situation that makes you anxious, say to yourself, This is not an emergency. I feel uncomfortable, but I am not in danger. I can keep going even if I feel anxious.  ?? Be kind to your body:  ? Relieve tension with exercise or a massage.  ? Get enough rest.  ? Avoid alcohol, caffeine, nicotine, and illegal drugs. They can increase your anxiety level and cause sleep problems.  ? Learn and do relaxation techniques. See below for more about these techniques.  ?? Engage your mind. Get out and do something you enjoy. Go to a funny movie, or take a walk or hike. Plan your day. Having too much or too little to do can make you anxious.  ?? Keep a record of your symptoms. Discuss your fears with a good friend or family member, or join a support group for people with similar problems. Talking to others sometimes relieves stress.  ?? Get involved in social groups, or volunteer to help others. Being alone sometimes makes things seem worse than they are.  ?? Get at least 30 minutes of exercise on most days of the week to relieve stress. Walking is a good choice. You also may want to do other activities, such as running, swimming, cycling, or playing tennis or team sports.    Relaxation techniques  Do relaxation exercises 10 to 20 minutes a day. You can play soothing, relaxing music while you do them, if you wish.  ?? Tell others in your house that you are going to do your relaxation exercises. Ask them not to disturb you.  ?? Find a comfortable place, away from all distractions and noise. ?? Lie down on your back, or sit with your back straight.  ?? Focus on your breathing. Make it slow and steady.  ?? Breathe in through your nose. Breathe out through either your nose or mouth.  ?? Breathe deeply, filling up the area between your navel and your rib cage. Breathe so that your belly goes up and down.  ?? Do not hold your breath.  ?? Breathe like this for 5 to 10 minutes. Notice the feeling of calmness throughout your whole body.  As you continue to breathe slowly and deeply, relax by doing the following for  another 5 to 10 minutes:  ?? Tighten and relax each muscle group in your body. You can begin at your toes and work your way up to your head.  ?? Imagine your muscle groups relaxing and becoming heavy.  ?? Empty your mind of all thoughts.  ?? Let yourself relax more and more deeply.  ?? Become aware of the state of calmness that surrounds you.  ?? When your relaxation time is over, you can bring yourself back to alertness by moving your fingers and toes and then your hands and feet and then stretching and moving your entire body. Sometimes people fall asleep during relaxation, but they usually wake up shortly afterward.  ?? Always give yourself time to return to full alertness before you drive a car or do anything that might cause an accident if you are not fully alert. Never play a relaxation tape while you drive a car.    When should you call for help?  Call 911 anytime you think you may need emergency care. For example, call if:    ?? You feel you cannot stop from hurting yourself or someone else.    Keep the numbers for these national suicide hotlines: 1-800-273-TALK 5170603094) and 1-800-SUICIDE 6417453321). If you or someone you know talks about suicide or feeling hopeless, get help right away.   Watch closely for changes in your health, and be sure to contact your doctor if:    ?? You have anxiety or fear that affects your life.     ?? You have symptoms of anxiety that are new or different from those you had before.     Where can you learn more?  Go to [facility] at [email or web address]  Select Preferences in the upper right hand corner, then select Health Library under Resources. Enter P754 in the search box to learn more about Anxiety Disorder: Care Instructions.    Current as of: March 11, 2017  Content Version: 12.0  ?? 2006-2019 Healthwise, Incorporated. Care instructions adapted under license by [facility]. If you have questions about a medical condition or this instruction, always ask your healthcare professional. Healthwise, Incorporated disclaims any warranty or liability for your use of this information.

## 2019-03-09 NOTE — Unmapped (Signed)
Called patient to schedule physical in 6 months anytime after feb 19th 2021. No answer at the time of the call so a voicemail was left.

## 2019-03-10 LAB — VITAMIN D, TOTAL (25OH): Lab: 25.5

## 2019-03-10 MED ORDER — ERGOCALCIFEROL (VITAMIN D2) 1,250 MCG (50,000 UNIT) CAPSULE
ORAL_CAPSULE | ORAL | 3 refills | 84 days | Status: CP
Start: 2019-03-10 — End: 2020-03-09
  Filled 2019-03-11: qty 12, 84d supply, fill #0

## 2019-03-11 LAB — THYROGLOBULIN ANTIBODY SCREEN: Thyroglobulin Ab:ACnc:Pt:Ser/Plas:Qn:IA: <1.8

## 2019-03-11 MED FILL — ERGOCALCIFEROL (VITAMIN D2) 1,250 MCG (50,000 UNIT) CAPSULE: 84 days supply | Qty: 12 | Fill #0 | Status: AC

## 2019-03-11 MED FILL — FLUTICASONE 250 MCG-SALMETEROL 50 MCG/DOSE BLISTR POWDR FOR INHALATION: 90 days supply | Qty: 180 | Fill #0 | Status: AC

## 2019-03-11 MED FILL — PANTOPRAZOLE 40 MG TABLET,DELAYED RELEASE: 90 days supply | Qty: 90 | Fill #0 | Status: AC

## 2019-03-11 MED FILL — MONTELUKAST 10 MG TABLET: 90 days supply | Qty: 90 | Fill #0 | Status: AC

## 2019-03-12 LAB — THYROID PEROXIDASE ANTIBODIES: Lab: 0.24

## 2019-03-15 ENCOUNTER — Ambulatory Visit: Admit: 2019-03-15 | Discharge: 2019-03-16 | Payer: PRIVATE HEALTH INSURANCE

## 2019-03-15 DIAGNOSIS — R928 Other abnormal and inconclusive findings on diagnostic imaging of breast: Secondary | ICD-10-CM

## 2019-03-23 ENCOUNTER — Ambulatory Visit: Admit: 2019-03-23 | Discharge: 2019-03-24 | Payer: PRIVATE HEALTH INSURANCE

## 2019-03-23 DIAGNOSIS — R928 Other abnormal and inconclusive findings on diagnostic imaging of breast: Secondary | ICD-10-CM

## 2019-03-31 DIAGNOSIS — N898 Other specified noninflammatory disorders of vagina: Secondary | ICD-10-CM

## 2019-03-31 DIAGNOSIS — R300 Dysuria: Secondary | ICD-10-CM

## 2019-03-31 MED ORDER — FLUCONAZOLE 150 MG TABLET
ORAL_TABLET | Freq: Once | ORAL | 0 refills | 2 days | Status: CP
Start: 2019-03-31 — End: 2019-04-02
  Filled 2019-03-31: qty 2, 2d supply, fill #0

## 2019-03-31 MED ORDER — CIPROFLOXACIN 500 MG TABLET
ORAL_TABLET | Freq: Two times a day (BID) | ORAL | 0 refills | 5 days | Status: CP
Start: 2019-03-31 — End: ?
  Filled 2019-03-31: qty 10, 5d supply, fill #0

## 2019-03-31 MED FILL — CIPROFLOXACIN 500 MG TABLET: 5 days supply | Qty: 10 | Fill #0 | Status: AC

## 2019-03-31 MED FILL — FLUCONAZOLE 150 MG TABLET: 2 days supply | Qty: 2 | Fill #0 | Status: AC

## 2019-04-13 MED FILL — SERTRALINE 50 MG TABLET: 90 days supply | Qty: 270 | Fill #0 | Status: AC

## 2019-04-13 MED FILL — SERTRALINE 50 MG TABLET: ORAL | 90 days supply | Qty: 270 | Fill #0

## 2019-04-19 ENCOUNTER — Encounter: Admit: 2019-04-19 | Discharge: 2019-04-19

## 2019-04-19 DIAGNOSIS — R69 Illness, unspecified: Principal | ICD-10-CM

## 2019-04-22 MED FILL — PROAIR HFA 90 MCG/ACTUATION AEROSOL INHALER: RESPIRATORY_TRACT | 25 days supply | Qty: 8.5 | Fill #0

## 2019-04-22 MED FILL — PROAIR HFA 90 MCG/ACTUATION AEROSOL INHALER: 25 days supply | Qty: 8 | Fill #0 | Status: AC

## 2019-05-10 MED FILL — METHOTREXATE SODIUM 2.5 MG TABLET: ORAL | 84 days supply | Qty: 96 | Fill #1

## 2019-05-10 MED FILL — FOLIC ACID 1 MG TABLET: ORAL | 90 days supply | Qty: 90 | Fill #0

## 2019-05-10 MED FILL — FOLIC ACID 1 MG TABLET: 90 days supply | Qty: 90 | Fill #0 | Status: AC

## 2019-05-10 MED FILL — METHOTREXATE SODIUM 2.5 MG TABLET: 84 days supply | Qty: 96 | Fill #1 | Status: AC

## 2019-05-18 ENCOUNTER — Ambulatory Visit: Admit: 2019-05-18 | Discharge: 2019-05-19 | Payer: PRIVATE HEALTH INSURANCE

## 2019-05-18 DIAGNOSIS — Z0184 Encounter for antibody response examination: Principal | ICD-10-CM

## 2019-05-21 LAB — COVID-19 IGG: SARS coronavirus 2 Ab.IgG:PrThr:Pt:Ser/Plas:Ord:IA: NEGATIVE

## 2019-05-31 MED FILL — ERGOCALCIFEROL (VITAMIN D2) 1,250 MCG (50,000 UNIT) CAPSULE: ORAL | 84 days supply | Qty: 12 | Fill #1

## 2019-06-09 MED FILL — MONTELUKAST 10 MG TABLET: ORAL | 90 days supply | Qty: 90 | Fill #1

## 2019-06-09 MED FILL — MONTELUKAST 10 MG TABLET: 90 days supply | Qty: 90 | Fill #1 | Status: AC

## 2019-06-22 ENCOUNTER — Ambulatory Visit: Admit: 2019-06-22 | Discharge: 2019-06-23 | Payer: PRIVATE HEALTH INSURANCE

## 2019-06-22 DIAGNOSIS — F419 Anxiety disorder, unspecified: Principal | ICD-10-CM

## 2019-06-22 DIAGNOSIS — F330 Major depressive disorder, recurrent, mild: Principal | ICD-10-CM

## 2019-06-22 MED ORDER — SERTRALINE 50 MG TABLET
ORAL_TABLET | Freq: Every day | ORAL | 1 refills | 90.00000 days | Status: CP
Start: 2019-06-22 — End: ?
  Filled 2019-06-23: qty 270, 90d supply, fill #0

## 2019-06-22 NOTE — Unmapped (Addendum)
Patient Education      ADULT PROTECTIVE SERVICES FOR THE COUNTY YOUR MOM LIVES IN.  THIS IS AN ANONYMOUS CALL.    Anxiety Disorder: Care Instructions  Your Care Instructions     Anxiety is a normal reaction to stress. Difficult situations can cause you to have symptoms such as sweaty palms and a nervous feeling.  In an anxiety disorder, the symptoms are far more severe. Constant worry, muscle tension, trouble sleeping, nausea and diarrhea, and other symptoms can make normal daily activities difficult or impossible. These symptoms may occur for no reason, and they can affect your work, school, or social life. Medicines, counseling, and self-care can all help.  Follow-up care is a key part of your treatment and safety. Be sure to make and go to all appointments, and call your doctor if you are having problems. It's also a good idea to know your test results and keep a list of the medicines you take.  How can you care for yourself at home?  ?? Take medicines exactly as directed. Call your doctor if you think you are having a problem with your medicine.  ?? Go to your counseling sessions and follow-up appointments.  ?? Recognize and accept your anxiety. Then, when you are in a situation that makes you anxious, say to yourself, This is not an emergency. I feel uncomfortable, but I am not in danger. I can keep going even if I feel anxious.  ?? Be kind to your body:  ? Relieve tension with exercise or a massage.  ? Get enough rest.  ? Avoid alcohol, caffeine, nicotine, and illegal drugs. They can increase your anxiety level and cause sleep problems.  ? Learn and do relaxation techniques. See below for more about these techniques.  ?? Engage your mind. Get out and do something you enjoy. Go to a funny movie, or take a walk or hike. Plan your day. Having too much or too little to do can make you anxious. ?? Keep a record of your symptoms. Discuss your fears with a good friend or family member, or join a support group for people with similar problems. Talking to others sometimes relieves stress.  ?? Get involved in social groups, or volunteer to help others. Being alone sometimes makes things seem worse than they are.  ?? Get at least 30 minutes of exercise on most days of the week to relieve stress. Walking is a good choice. You also may want to do other activities, such as running, swimming, cycling, or playing tennis or team sports.  Relaxation techniques  Do relaxation exercises 10 to 20 minutes a day. You can play soothing, relaxing music while you do them, if you wish.  ?? Tell others in your house that you are going to do your relaxation exercises. Ask them not to disturb you.  ?? Find a comfortable place, away from all distractions and noise.  ?? Lie down on your back, or sit with your back straight.  ?? Focus on your breathing. Make it slow and steady.  ?? Breathe in through your nose. Breathe out through either your nose or mouth.  ?? Breathe deeply, filling up the area between your navel and your rib cage. Breathe so that your belly goes up and down.  ?? Do not hold your breath.  ?? Breathe like this for 5 to 10 minutes. Notice the feeling of calmness throughout your whole body.  As you continue to breathe slowly and deeply, relax by doing the  following for another 5 to 10 minutes:  ?? Tighten and relax each muscle group in your body. You can begin at your toes and work your way up to your head.  ?? Imagine your muscle groups relaxing and becoming heavy.  ?? Empty your mind of all thoughts.  ?? Let yourself relax more and more deeply.  ?? Become aware of the state of calmness that surrounds you. ?? When your relaxation time is over, you can bring yourself back to alertness by moving your fingers and toes and then your hands and feet and then stretching and moving your entire body. Sometimes people fall asleep during relaxation, but they usually wake up shortly afterward.  ?? Always give yourself time to return to full alertness before you drive a car or do anything that might cause an accident if you are not fully alert. Never play a relaxation tape while you drive a car.  When should you call for help?   Call 911 anytime you think you may need emergency care. For example, call if:  ?? ?? You feel you cannot stop from hurting yourself or someone else.   Keep the numbers for these national suicide hotlines: 1-800-273-TALK 214-463-9704) and 1-800-SUICIDE 878-228-3576). If you or someone you know talks about suicide or feeling hopeless, get help right away.  Watch closely for changes in your health, and be sure to contact your doctor if:  ?? ?? You have anxiety or fear that affects your life.   ?? ?? You have symptoms of anxiety that are new or different from those you had before.   Where can you learn more?  Go to [facility] at [email or web address]  Select Patient Education under [employer]. Enter P754 in the search box to learn more about Anxiety Disorder: Care Instructions.  Current as of: March 24, 2019??????????????????????????????Content Version: 12.7  ?? 2006-2020 Healthwise, Incorporated.   Care instructions adapted under license by [facility]. If you have questions about a medical condition or this instruction, always ask your healthcare professional. Healthwise, Incorporated disclaims any warranty or liability for your use of this information.

## 2019-06-22 NOTE — Unmapped (Signed)
Name:  Alexis Jenkins  DOB: 04-Apr-1971  Today's Date: 06/22/2019   Age:  48 y.o.    A/P:  Mild episode of recurrent major depressive disorder (CMS-HCC)/ Anxiety.  Depression and anxiety at goal with present therapy of Zoloft 150 mg daily. At this time, there is no need for referral to additional depression care-management support.  Continue current treatment plan.  Patient verbalized an understanding of today's recommendations. Refills given. F/u 6 months, sooner with any concerns.   - sertraline (ZOLOFT) 50 MG tablet . Take 3 tablets (150 mg total) by mouth daily.,     FMLA paperwork was filled out with the patient for her to help care for her mother who has Alzheimers.     Greater than 35 minutes was spent with this patient with more than 50% being in face to face counseling and coordination of care.     Preventive services addressed today  Preventive services are currently up to date    Medication adherence and barriers to the treatment plan have been addressed. Opportunities to optimize healthy behaviors have been discussed. Patient / caregiver voiced understanding.    Return if symptoms worsen or fail to improve.      S:  Alexis Jenkins is a 48 y.o. female who presents to fill out FMLA paperwork so she can take care of her mother as well as followup on anxiety/depression (Zoloft 150 mg daily).    Depression Alexis Jenkins is a 48 y.o. female who presents for follow up of anxiety/depression. PHQ-9 of zero.  GAD-7 of zero. Current symptoms include some increased stress regarding the care of her parents, holiday stress, covid stress, and not being able to see children for the holidays.  Patient feels most of the stress she feels will resolve as things gets settled.  Patient is doing well. Has no symptoms of depression currently. . Symptoms have been stable  since that time. Patient denies anhedonia, depressed mood, difficulty concentrating, feelings of worthlessness/guilt, hopelessness and hypersomnia. Previous treatment includes: medication. She complains of the following side effects from the treatment: none. Sheis not doing regular exercise.Is down to Zoloft 125mg  daily and doing well.     Patient is also presents with FMLA paperwork to fill out to assist in the care of her mom with her father having symptoms of dementia.  Patient also requests social worker/adult protective services to drop in to assess safety in the home environment. Patient would also like home health services with someone in the community to help her mom get dressed and up and provide 2 meals and look in on both parents.  Forms filled out and faxed after visit.    ROS:  A 12 point review of systems was negative except for pertinent items noted in the HPI.    Past medical history, family history, surgical history and social history personally reviewed and verified with patient.     PROBLEM LIST  Patient Active Problem List   Diagnosis   ??? Anxiety   ??? Mild intermittent asthma without complication   ??? Depression   ??? GERD (gastroesophageal reflux disease)   ??? Granulomatosis with polyangiitis of nose (CMS-HCC)   ??? Wegener's disease, pulmonary (CMS-HCC)   ??? Nasal septal perforation   ??? Positive ANA (antinuclear antibody)   ??? Sensorineural hearing loss of right ear       O:  Vitals:    06/22/19 0805   BP: 120/80   Pulse: 75   Resp: 18 Temp: 37.1 ??  C (98.7 ??F)   SpO2: 98%     General appearance: No acute distress, well appearing and well nourished.   HEENT: Normocephalic, atraumatic. PERRLA. EOMI.  HEART: RRR w/ normal S1 and S2. No MRG. DP and PT pulses  2+ bilaterally. No peripheral edema or varicosities.  LUNGS: No signs of respiratory distress. CTA all fields. No wheezes, rales, rhonchi or crackles.   ABDOMEN: Non-tender, no masses. Normoactive BS. No hernia appreciated.   MSK: Normal ROM. Strength normal, equal bilaterally.   SKIN: Warm, dry, intact. No suspicious lesions noted on face, trunk, or extremeties.     PSYCH: A&O x 3. Affect normal. Judgment, insight normal. Makes good eye contact.  Well-kempt.    I attest that I, Geralyn Mora Appl, personally documented this note while acting as scribe for [employer], PA.      Geralyn A Mazzotta, Scribe.  06/22/2019     The documentation recorded by the scribe accurately reflects the service I personally performed and the decisions made by me.    Gabriel Rainwater, PA

## 2019-06-23 MED FILL — SERTRALINE 50 MG TABLET: 90 days supply | Qty: 270 | Fill #0 | Status: AC

## 2019-06-23 MED FILL — PANTOPRAZOLE 40 MG TABLET,DELAYED RELEASE: ORAL | 30 days supply | Qty: 30 | Fill #1

## 2019-06-23 MED FILL — PANTOPRAZOLE 40 MG TABLET,DELAYED RELEASE: 30 days supply | Qty: 30 | Fill #1 | Status: AC

## 2019-07-08 ENCOUNTER — Ambulatory Visit: Admit: 2019-07-08 | Discharge: 2019-07-09 | Payer: PRIVATE HEALTH INSURANCE

## 2019-07-08 NOTE — Unmapped (Signed)
Pt ID verified with Name and Date of birth. The EUA Covid-19 Fact Sheet given to patient. All screening questions answered. Verbal consent taken. Vaccine administered as ordered.  See immunization history for documentation.  Pt tolerated well with no issues noted. Recommend that patient stay in observation area for 15 minutes.

## 2019-07-23 MED FILL — PANTOPRAZOLE 40 MG TABLET,DELAYED RELEASE: ORAL | 30 days supply | Qty: 30 | Fill #2

## 2019-07-23 MED FILL — PANTOPRAZOLE 40 MG TABLET,DELAYED RELEASE: 30 days supply | Qty: 30 | Fill #2 | Status: AC

## 2019-07-29 ENCOUNTER — Ambulatory Visit: Admit: 2019-07-29 | Discharge: 2019-07-30 | Payer: PRIVATE HEALTH INSURANCE

## 2019-08-05 MED FILL — METHOTREXATE SODIUM 2.5 MG TABLET: ORAL | 84 days supply | Qty: 96 | Fill #2

## 2019-08-05 MED FILL — METHOTREXATE SODIUM 2.5 MG TABLET: 84 days supply | Qty: 96 | Fill #2 | Status: AC

## 2019-08-25 MED FILL — ERGOCALCIFEROL (VITAMIN D2) 1,250 MCG (50,000 UNIT) CAPSULE: ORAL | 84 days supply | Qty: 12 | Fill #2

## 2019-08-25 MED FILL — ERGOCALCIFEROL (VITAMIN D2) 1,250 MCG (50,000 UNIT) CAPSULE: 84 days supply | Qty: 12 | Fill #2 | Status: AC

## 2019-08-25 MED FILL — PANTOPRAZOLE 40 MG TABLET,DELAYED RELEASE: 30 days supply | Qty: 30 | Fill #3 | Status: AC

## 2019-08-25 MED FILL — FOLIC ACID 1 MG TABLET: ORAL | 90 days supply | Qty: 90 | Fill #1

## 2019-08-25 MED FILL — FOLIC ACID 1 MG TABLET: 90 days supply | Qty: 90 | Fill #1 | Status: AC

## 2019-08-25 MED FILL — PANTOPRAZOLE 40 MG TABLET,DELAYED RELEASE: ORAL | 30 days supply | Qty: 30 | Fill #3

## 2019-09-10 MED FILL — MONTELUKAST 10 MG TABLET: ORAL | 90 days supply | Qty: 90 | Fill #2

## 2019-09-10 MED FILL — MONTELUKAST 10 MG TABLET: 90 days supply | Qty: 90 | Fill #2 | Status: AC

## 2019-09-22 MED FILL — PANTOPRAZOLE 40 MG TABLET,DELAYED RELEASE: 30 days supply | Qty: 30 | Fill #4 | Status: AC

## 2019-09-22 MED FILL — PANTOPRAZOLE 40 MG TABLET,DELAYED RELEASE: ORAL | 30 days supply | Qty: 30 | Fill #4

## 2019-09-24 ENCOUNTER — Encounter: Admit: 2019-09-24 | Discharge: 2019-09-24

## 2019-09-24 DIAGNOSIS — R69 Illness, unspecified: Principal | ICD-10-CM

## 2019-10-21 MED FILL — SERTRALINE 50 MG TABLET: 90 days supply | Qty: 270 | Fill #1 | Status: AC

## 2019-10-21 MED FILL — SERTRALINE 50 MG TABLET: ORAL | 90 days supply | Qty: 270 | Fill #1

## 2019-10-21 MED FILL — PANTOPRAZOLE 40 MG TABLET,DELAYED RELEASE: 30 days supply | Qty: 30 | Fill #5 | Status: AC

## 2019-10-21 MED FILL — PANTOPRAZOLE 40 MG TABLET,DELAYED RELEASE: ORAL | 30 days supply | Qty: 30 | Fill #5

## 2019-11-01 MED FILL — METHOTREXATE SODIUM 2.5 MG TABLET: ORAL | 84 days supply | Qty: 96 | Fill #3

## 2019-11-01 MED FILL — METHOTREXATE SODIUM 2.5 MG TABLET: 84 days supply | Qty: 96 | Fill #3 | Status: AC

## 2019-11-08 NOTE — Unmapped (Deleted)
Established Patient Video Visit Note  This medical encounter was conducted virtually using [email or web address]  Tele[facility] protocols.    {    Coding tips - Do not edit this text, it will delete upon signing of note!    ?? Telephone visits [phone] for Physicians and APP??s and [phone] for Non- Physician Clinicians)- Only use minutes on the phone to determine level of service.    ?? Video visits [phone]) - Use both minutes on video and pre/post minutes to determine level of service.       :75688}    I spent *** minutes on the real-time audio and video with the patient on the date of service. I spent an additional *** minutes on pre- and post-visit activities on the date of service.     The patient was physically located in [place] or a state in which I am permitted to provide care. The patient and/or parent/guardian understood that s/he may incur co-pays and cost sharing, and agreed to the telemedicine visit. The visit was reasonable and appropriate under the circumstances given the patient's presentation at the time.    The patient and/or parent/guardian has been advised of the potential risks and limitations of this mode of treatment (including, but not limited to, the absence of in-person examination) and has agreed to be treated using telemedicine. The patient's/patient's family's questions regarding telemedicine have been answered.     If the visit was completed in an ambulatory setting, the patient and/or parent/guardian has also been advised to contact their provider???s office for worsening conditions, and seek emergency medical treatment and/or call 911 if the patient deems either necessary.        I have identified myself to the patient and conveyed my credentials to Ms. Oliveria  I have explained the capabilities and limitations of telemedicine and the patient/proxy and myself both agree that it is appropriate for their current circumstances/symptoms.  In case we get disconnected, patient's phone number is [phone] (home)    Patient has signed informed consent on file in medical record.  Is there someone else in the room? No.     Ms. Grealish is a 49 y.o. female that presents for a video visit today regarding the following issues:    Assessment/Plan:      There are no diagnoses linked to this encounter.     Followup:  No follow-ups on file.    I personally spent *** minutes face-to-face and non-face-to-face in the care of this patient, which includes all pre, intra, and post visit time on the date of service.       Subjective:     HPI  Ms. Sozio is a 49 y.o. female requesting a video visit to discuss the following issues: followup depression (Zoloft 150 mg daily).    Depression    Zhane Donlan is a 49 y.o. female who presents for follow up of depression. Current symptoms include {depression symptoms:1002}. Symptoms have been {clinical course:17} since that time. Patient denies {depression symptoms:1002}. Previous treatment includes: {depression treatment:1010}. She complains of the following side effects from the treatment: {side effects:15372}. {He She:27869::He}{is/is not:23060} doing regular exercise.          ***    ROS  As per HPI.       HISTORY  I have reviewed the patient's problem list, current medications, and allergies and have updated/reconciled them as needed.    Ms. Kuk  reports that she has never smoked. She has never used smokeless tobacco.  Objective:     As part of this Video Visit, no in person exam was conducted.  Video interaction permitted the following observations.    General: Well appearing , in no acute distress  RESP: Relaxed respiratory effort.   SKIN: Appropriately warm and moist.  NEURO: Stable gait and coordination.  PSYCH: Alert and oriented.  Speech fluent and sensible.  Calm affect.       I attest that I, Lenard Galloway, personally documented this note while acting as scribe for [employer], PA-C     [employer], Scribe.  11/08/2019     The documentation recorded by the scribe accurately reflects the service I personally performed and the decisions made by me.     Monica Martinez, PA-C       [facility]  [address] [place], [place] [address] Telephone [phone] ??? Fax [phone]

## 2019-11-19 MED FILL — ERGOCALCIFEROL (VITAMIN D2) 1,250 MCG (50,000 UNIT) CAPSULE: ORAL | 84 days supply | Qty: 12 | Fill #3

## 2019-11-19 MED FILL — ERGOCALCIFEROL (VITAMIN D2) 1,250 MCG (50,000 UNIT) CAPSULE: 84 days supply | Qty: 12 | Fill #3 | Status: AC

## 2019-11-30 MED FILL — PANTOPRAZOLE 40 MG TABLET,DELAYED RELEASE: ORAL | 30 days supply | Qty: 30 | Fill #6

## 2019-11-30 MED FILL — PANTOPRAZOLE 40 MG TABLET,DELAYED RELEASE: 30 days supply | Qty: 30 | Fill #6 | Status: AC

## 2019-12-13 DIAGNOSIS — Z0000 Encounter for general adult medical examination without abnormal findings: Principal | ICD-10-CM

## 2019-12-13 DIAGNOSIS — E559 Vitamin D deficiency, unspecified: Principal | ICD-10-CM

## 2019-12-16 ENCOUNTER — Ambulatory Visit: Admit: 2019-12-16 | Discharge: 2019-12-17 | Payer: PRIVATE HEALTH INSURANCE

## 2019-12-16 LAB — EGFR CKD-EPI AA FEMALE: Glomerular filtration rate/1.73 sq M.predicted.black:ArVRat:Pt:Ser/Plas/Bld:Qn:Creatinine-based formula (CKD-EPI): >90

## 2019-12-16 LAB — THYROID STIMULATING HORMONE: Chemistry studies:Cmplx:-:^Patient:Set:: 0.871

## 2019-12-16 LAB — CBC
HEMATOCRIT: 38.5 % (ref 35.0–44.0)
HEMOGLOBIN: 12.8 g/dL (ref 12.0–15.5)
MEAN CORPUSCULAR HEMOGLOBIN CONC: 33.4 g/dL (ref 30.0–36.0)
MEAN CORPUSCULAR HEMOGLOBIN: 30.2 pg (ref 26.0–34.0)
MEAN PLATELET VOLUME: 7.5 fL (ref 7.0–10.0)
PLATELET COUNT: 260 10*9/L (ref 150–450)
RED BLOOD CELL COUNT: 4.25 10*12/L (ref 3.90–5.03)
RED CELL DISTRIBUTION WIDTH: 16.1 % — ABNORMAL HIGH (ref 12.0–15.0)
WBC ADJUSTED: 7.2 10*9/L (ref 3.5–10.5)

## 2019-12-16 LAB — COMPREHENSIVE METABOLIC PANEL
ALBUMIN: 3.6 g/dL (ref 3.4–5.0)
ALKALINE PHOSPHATASE: 61 U/L (ref 46–116)
ALT (SGPT): 15 U/L (ref 7–40)
ANION GAP: 10 mmol/L (ref 7–24)
BILIRUBIN TOTAL: 0.8 mg/dL (ref 0.3–1.2)
BLOOD UREA NITROGEN: 23 mg/dL (ref 9–23)
BUN / CREAT RATIO: 37
CALCIUM: 8.9 mg/dL (ref 8.7–10.4)
CHLORIDE: 103 mmol/L (ref 98–107)
CO2: 25.3 mmol/L (ref 20.0–31.0)
CREATININE: 0.63 mg/dL (ref 0.50–0.80)
EGFR CKD-EPI AA FEMALE: >90 mL/min/{1.73_m2} (ref >=60)
EGFR CKD-EPI NON-AA FEMALE: >90 mL/min/{1.73_m2} (ref >=60)
GLUCOSE RANDOM: 87 mg/dL (ref 70–99)
PROTEIN TOTAL: 7.2 g/dL (ref 5.7–8.2)
SODIUM: 138 mmol/L (ref 136–145)

## 2019-12-16 LAB — LIPID PANEL
CHOLESTEROL/HDL RATIO SCREEN: 2.7 (ref 1.0–4.5)
CHOLESTEROL: 187 mg/dL (ref <=200)
HDL CHOLESTEROL: 69 mg/dL — ABNORMAL HIGH (ref 40–60)
LDL CHOLESTEROL CALCULATED: 102 mg/dL — ABNORMAL HIGH (ref 40–100)
TRIGLYCERIDES: 79 mg/dL (ref 0–150)

## 2019-12-16 LAB — MEAN PLATELET VOLUME: Platelet mean volume:EntVol:Pt:Bld:Qn:Automated count: 7.5

## 2019-12-16 LAB — VLDL CHOLESTEROL CAL: Chemistry studies:Cmplx:-:^Patient:Set:: 15.8

## 2019-12-17 ENCOUNTER — Ambulatory Visit: Admit: 2019-12-17 | Discharge: 2019-12-18 | Payer: PRIVATE HEALTH INSURANCE

## 2019-12-17 DIAGNOSIS — F330 Major depressive disorder, recurrent, mild: Principal | ICD-10-CM

## 2019-12-17 DIAGNOSIS — Z0000 Encounter for general adult medical examination without abnormal findings: Principal | ICD-10-CM

## 2019-12-17 DIAGNOSIS — K219 Gastro-esophageal reflux disease without esophagitis: Principal | ICD-10-CM

## 2019-12-17 DIAGNOSIS — Z1211 Encounter for screening for malignant neoplasm of colon: Principal | ICD-10-CM

## 2019-12-17 DIAGNOSIS — E559 Vitamin D deficiency, unspecified: Principal | ICD-10-CM

## 2019-12-17 DIAGNOSIS — R928 Other abnormal and inconclusive findings on diagnostic imaging of breast: Principal | ICD-10-CM

## 2019-12-17 DIAGNOSIS — F419 Anxiety disorder, unspecified: Principal | ICD-10-CM

## 2019-12-17 DIAGNOSIS — J4520 Mild intermittent asthma, uncomplicated: Principal | ICD-10-CM

## 2019-12-17 DIAGNOSIS — M3130 Wegener's granulomatosis without renal involvement: Principal | ICD-10-CM

## 2019-12-17 LAB — VITAMIN D, TOTAL (25OH): Lab: 43.3

## 2019-12-17 MED ORDER — PANTOPRAZOLE 20 MG TABLET,DELAYED RELEASE
ORAL_TABLET | Freq: Every day | ORAL | 1 refills | 90 days | Status: CP
Start: 2019-12-17 — End: 2020-12-16
  Filled 2020-01-05: qty 90, 90d supply, fill #0

## 2019-12-17 MED ORDER — SERTRALINE 50 MG TABLET
ORAL_TABLET | Freq: Every day | ORAL | 3 refills | 90 days | Status: CP
Start: 2019-12-17 — End: ?
  Filled 2019-12-27: qty 270, 90d supply, fill #0

## 2019-12-17 MED ORDER — ERGOCALCIFEROL (VITAMIN D2) 1,250 MCG (50,000 UNIT) CAPSULE
ORAL_CAPSULE | ORAL | 3 refills | 84 days | Status: CP
Start: 2019-12-17 — End: 2020-12-16
  Filled 2020-02-18: qty 12, 84d supply, fill #0

## 2019-12-17 MED ORDER — MONTELUKAST 10 MG TABLET
ORAL_TABLET | Freq: Every day | ORAL | 3 refills | 90 days | Status: CP
Start: 2019-12-17 — End: ?
  Filled 2019-12-17: qty 90, 90d supply, fill #0

## 2019-12-17 MED FILL — FOLIC ACID 1 MG TABLET: 90 days supply | Qty: 90 | Fill #2 | Status: AC

## 2019-12-17 MED FILL — FOLIC ACID 1 MG TABLET: ORAL | 90 days supply | Qty: 90 | Fill #2

## 2019-12-17 MED FILL — MONTELUKAST 10 MG TABLET: 90 days supply | Qty: 90 | Fill #0 | Status: AC

## 2019-12-17 NOTE — Unmapped (Signed)
Patient Education        Well Visit, Ages 63 to 37: Care Instructions  Overview     Well visits can help you stay healthy. Your doctor has checked your overall health and may have suggested ways to take good care of yourself. Your doctor also may have recommended tests. At home, you can help prevent illness with healthy eating, regular exercise, and other steps.  Follow-up care is a key part of your treatment and safety. Be sure to make and go to all appointments, and call your doctor if you are having problems. It's also a good idea to know your test results and keep a list of the medicines you take.  How can you care for yourself at home?  ?? Get screening tests that you and your doctor decide on. Screening helps find diseases before any symptoms appear.  ?? Eat healthy foods. Choose fruits, vegetables, whole grains, protein, and low-fat dairy foods. Limit fat, especially saturated fat. Reduce salt in your diet.  ?? Limit alcohol. If you are a man, have no more than 2 drinks a day or 14 drinks a week. If you are a woman, have no more than 1 drink a day or 7 drinks a week.  ?? Get at least 30 minutes of physical activity on most days of the week. Walking is a good choice. You also may want to do other activities, such as running, swimming, cycling, or playing tennis or team sports. Discuss any changes in your exercise program with your doctor.  ?? Reach and stay at a healthy weight. This will lower your risk for many problems, such as obesity, diabetes, heart disease, and high blood pressure.  ?? Do not smoke or allow others to smoke around you. If you need help quitting, talk to your doctor about stop-smoking programs and medicines. These can increase your chances of quitting for good.  ?? Care for your mental health. It is easy to get weighed down by worry and stress. Learn strategies to manage stress, like deep breathing and mindfulness, and stay connected with your family and community. If you find you often feel sad or hopeless, talk with your doctor. Treatment can help.  ?? Talk to your doctor about whether you have any risk factors for sexually transmitted infections (STIs). You can help prevent STIs if you wait to have sex with a new partner (or partners) until you've each been tested for STIs. It also helps if you use condoms (female or female condoms) and if you limit your sex partners to one person who only has sex with you. Vaccines are available for some STIs, such as HPV.  ?? Use birth control if it's important to you to prevent pregnancy. Talk with your doctor about the choices available and what might be best for you.  ?? If you think you may have a problem with alcohol or drug use, talk to your doctor. This includes prescription medicines (such as amphetamines and opioids) and illegal drugs (such as cocaine and methamphetamine). Your doctor can help you figure out what type of treatment is best for you.  ?? Protect your skin from too much sun. When you're outdoors from 10 a.m. to 4 p.m., stay in the shade or cover up with clothing and a hat with a wide brim. Wear sunglasses that block UV rays. Even when it's cloudy, put broad-spectrum sunscreen (SPF 30 or higher) on any exposed skin.  ?? See a dentist one or two times a year  for checkups and to have your teeth cleaned.  ?? Wear a seat belt in the car.  When should you call for help?  Watch closely for changes in your health, and be sure to contact your doctor if you have any problems or symptoms that concern you.  Where can you learn more?  Go to [facility] at https://myuncchart.org  Select Patient Education under [employer]. Enter P072 in the search box to learn more about Well Visit, Ages 77 to 65: Care Instructions.  Current as of: Nov 25, 2018??????????????????????????????Content Version: 12.9  ?? 2006-2021 Healthwise, Incorporated.   Care instructions adapted under license by [facility]. If you have questions about a medical condition or this instruction, always ask your healthcare professional. Healthwise, Incorporated disclaims any warranty or liability for your use of this information.

## 2019-12-17 NOTE — Unmapped (Signed)
Name:  Alexis Jenkins  DOB: 02-Apr-1971  Today's Date: 12/17/2019   Age:  49 y.o.    Assessment and Plan:     Routine general medical examination at a health care facility: Age-appropriate counseling was performed. All health maintenance was reviewed. Recommended healthy low carbohydrate diet and regular physical activity. Patient is up-to-date on Pap smear.  Ordered mammogram and colonoscopy.  She will be back on Monday to get her Pap shingles. Will contact patient once lab results return. Next CPE with fasting labs one year.    Mild episode of recurrent major depressive disorder (CMS-HCC)/ Anxiety: Depression/Anxiety at goal with present therapy.  At this time, there is no need for referral to additional depression care-management support.  Continue current treatment plan.  Patient verbalized an understanding of today's recommendations. Refills given. F/u 6 months, sooner with any concerns.   -sertraline (ZOLOFT) 50 MG tablet; Take 3 tablets (150 mg total) by mouth daily.    Wegener's disease, pulmonary (CMS-HCC): stable. Followed by rheumatology.     Mild intermittent asthma without complication: Stable.  Patient will continue Singulair, Advair.  Refills given.  Use albuterol as needed.  - montelukast (SINGULAIR) 10 mg tablet; TAKE 1 TABLET BY MOUTH EVERY DAY    Gastroesophageal reflux disease, unspecified whether esophagitis present: We will attempt to decrease Protonix to 20 mg daily.  Patient will see how she does over the next couple of months and then we can try weaning off that as well.  - pantoprazole (PROTONIX) 20 MG tablet; Take 1 tablet (20 mg total) by mouth daily.    Vitamin D deficiency: Patient continue weekly vitamin D.  We are waiting on her vitamin D level to determine follow-up.  - ergocalciferol-1,250 mcg, 50,000 unit, (DRISDOL) 1,250 mcg (50,000 unit) capsule; Take 1 capsule (50,000 Units total) by mouth once a week.    Screening for colon cancer: Order placed for colonoscopy.  - Ambulatory referral to Gastroenterology; Future    Abnormal mammogram  - Mammo Digital Diagnostic Tomo Bilateral W CAD; Future  - US Breast Limited Bilateral; Future    Medication adherence and barriers to the treatment plan have been addressed. Opportunities to optimize healthy behaviors have been discussed. Patient / caregiver voiced understanding.    I personally spent 35 minutes face-to-face and non-face-to-face in the care of this patient, which includes all pre, intra, and post visit time on the date of service.      No follow-ups on file.    Subjective:     Alexis Jenkins 49 y.o.female   is being seen for a health maintenance exam.  Last pap smear was 02/202/2020, with a normal pap and negative HPV  No LMP recorded. Patient has had a hysterectomy.    Chief Complaint   Patient presents with   ??? Annual Exam     Patient reports that her mood has generally been good.  She is worried about her daughter leaving to go to college that she is going to miss her.  She is currently separated from her husband.  She denies any worsening of her depression or anxiety.  Well    In regards to patient's reflux, she has been on 40 mg of Protonix for years.  Says that she feels like her reflux is better now that she has lost weight but she would like to try to decrease the dose.  Lifestyle:   Diet: has been doing weight watchers and has lost 20 pounds in the past 2 months.  Physical Activity: walking 5 miles every morning  Sleep: Typically gets 7-8 hours sleep nightly.    Dental and Eye exams are both up to date, per patient. .     Social History:     Social History     Socioeconomic History   ??? Marital status: Married     Spouse name: Morton Peters Julien Girt)   ??? Number of children: 2   ??? Years of education: None   ??? Highest education level: None   Occupational History   ??? Occupation: Lab at [employer]   Tobacco Use   ??? Smoking status: Never Smoker   ??? Smokeless tobacco: Never Used   Vaping Use   ??? Vaping Use: Never used   Substance and Sexual Activity   ??? Alcohol use: Yes     Alcohol/week: 3.0 standard drinks     Types: 3 Glasses of wine per week   ??? Drug use: Never   ??? Sexual activity: Yes     Partners: Male     Birth control/protection: None   Other Topics Concern   ??? None   Social History Narrative   ??? None     Social Determinants of Health     Financial Resource Strain:    ??? Difficulty of Paying Living Expenses:    Food Insecurity:    ??? Worried About Programme researcher, broadcasting/film/video in the Last Year:    ??? Barista in the Last Year:    Transportation Needs:    ??? Freight forwarder (Medical):    ??? Lack of Transportation (Non-Medical):    Physical Activity:    ??? Days of Exercise per Week:    ??? Minutes of Exercise per Session:    Stress:    ??? Feeling of Stress :    Social Connections:    ??? Frequency of Communication with Friends and Family:    ??? Frequency of Social Gatherings with Friends and Family:    ??? Attends Religious Services:    ??? Database administrator or Organizations:    ??? Attends Engineer, structural:    ??? Marital Status:      separated    Past Medical/Surgical History:     Past Medical History:   Diagnosis Date   ??? Anxiety    ??? Asthma    ??? Depression    ??? GERD (gastroesophageal reflux disease)    ??? Hyperlipidemia      Past Surgical History:   Procedure Laterality Date   ??? breast lift  2010   ??? BREAST SURGERY      augmentation   ??? button in nose for septum  2019   ??? DNC  1991 & 2004   ??? HAND SURGERY  2011   ??? PARTIAL HYSTERECTOMY  2012   ??? RHINOPLASTY  2005       Family History:     Family History   Problem Relation Age of Onset   ??? Diabetes Mother         type 2   ??? Hypertension Mother    ??? Depression Mother    ??? Anxiety disorder Mother    ??? Hyperlipidemia Mother    ??? No Known Problems Father    ??? Anxiety disorder Sister    ??? Depression Sister    ??? Charcot-Marie-Tooth disease Daughter    ??? Myasthenia gravis Daughter    ??? Alzheimer's disease Maternal Grandmother    ??? Heart disease Maternal  Grandfather    ??? Cancer Maternal Grandfather         lung cancer (smoker). colon cancer   ??? No Known Problems Paternal Grandmother    ??? No Known Problems Paternal Grandfather    ??? Substance Abuse Disorder Neg Hx        Allergies:     Erythromycin base, Nitrofurantoin macrocrystal, and Xanax [alprazolam]    Current Medications:     Current Outpatient Medications   Medication Sig Dispense Refill   ??? albuterol HFA 90 mcg/actuation inhaler Inhale 2 puffs Every four (4) hours. 8.5 g 3   ??? coenzyme Q10 (CO Q-10) 10 mg capsule Take 10 mg by mouth daily.     ??? ergocalciferol-1,250 mcg, 50,000 unit, (DRISDOL) 1,250 mcg (50,000 unit) capsule Take 1 capsule (50,000 Units total) by mouth once a week. 12 capsule 3   ??? fish oil-omega-3 fatty acids 300-1,000 mg capsule Take 2 g by mouth daily.     ??? fluticasone propion-salmeteroL (ADVAIR DISKUS) 250-50 mcg/dose diskus Inhale 1 puff Two (2) times a day. 180 each 3   ??? folic acid (FOLVITE) 1 MG tablet Take 1 tablet (1 mg total) by mouth daily. 90 tablet 3   ??? glucosamine sulfate (GLUCOSAMINE) 500 mg Tab Take by mouth.     ??? methotrexate 2.5 MG tablet Take 8 tablets (20 mg total) by mouth every seven (7) days. 96 tablet 3   ??? montelukast (SINGULAIR) 10 mg tablet TAKE 1 TABLET BY MOUTH EVERY DAY 90 tablet 3   ??? sertraline (ZOLOFT) 50 MG tablet Take 3 tablets (150 mg total) by mouth daily. 270 tablet 3   ??? pantoprazole (PROTONIX) 20 MG tablet Take 1 tablet (20 mg total) by mouth daily. 90 tablet 1     No current facility-administered medications for this visit.       Health Maintenance:        PREVENTIVE SERVICES SUMMARY    -     -  Colorectal Cancer Screening:    Colonoscopy date: Not Found - Order placed for Colonoscopy   -  Breast Cancer Screening:    Mammogram date: 03/23/2019 - Order placed for mammogram today   -  Cervical Cancer Screening:    Pap Smear date: 08/21/2018   - Uptodate  -  Shingrix: ordered     Immunizations:     Immunization History   Administered Date(s) Administered   ??? COVID-19 VACC,MRNA,(PFIZER)(PF)(IM) 07/08/2019, 07/29/2019   ??? INFLUENZA TIV (TRI) 75MO+ W/ PRESERV (IM) 04/01/2015   ??? INFLUENZA TIV (TRI) PF (IM) 03/10/2019   ??? Influenza Vaccine Quad (IIV4 PF) 79mo+ injectable 05/01/2014, 03/17/2015   ??? Influenza Virus Vaccine, unspecified formulation 03/23/2016, 03/28/2017, 03/10/2018   ??? PNEUMOCOCCAL POLYSACCHARIDE 23 07/03/2014   ??? Pneumococcal Conjugate 13-Valent 12/12/2017   ??? TdaP 07/01/2012   ??? Tetanus and diptheria,(adult), adsorbed, 2Lf tetanus toxoid, PF 03/27/2005       I have reviewed and (if needed) updated the patient's problem list, medications, allergies, past medical and surgical history, preventive services, and social and family history.    ROS:     A 12 point review of systems was negative except for pertinent items noted in the HPI.    Vital Signs:     Wt Readings from Last 3 Encounters:   12/17/19 70.3 kg (155 lb)   06/22/19 78.9 kg (174 lb)   03/05/19 79.6 kg (175 lb 6.4 oz)     Temp Readings from Last 3 Encounters:   12/17/19  36.7 ??C (98.1 ??F) (Oral)   06/22/19 37.1 ??C (98.7 ??F) (Oral)   03/05/19 36.6 ??C (97.9 ??F) (Oral)     BP Readings from Last 3 Encounters:   12/17/19 102/78   06/22/19 120/80   03/05/19 116/86     Pulse Readings from Last 3 Encounters:   12/17/19 86   06/22/19 75   03/05/19 86     Estimated body mass index is 27.46 kg/m?? as calculated from the following:    Height as of this encounter: 160 cm (5' 3).    Weight as of this encounter: 70.3 kg (155 lb).  Facility age limit for growth percentiles is 20 years.        Objective:     General Appearance: Alert, cooperative, no distress, appears stated age.   Head and Neck: Normocephalic, atraumatic. No  masses. No thyromegaly. No bruits.   EENT: Eyes: PERRLA, Conjuntiva clear; Ears: bilateral TMs normal; Nose: septum and turbinates unremarkable; Throat: mucus membranes moist. No tonsilar enlargement or exudates. No OP erythema.  Cardiovascular:  Regular rate and rhythm. No murmurs  Respiratory: Lungs clear to auscultation bilaterally,  Respiratory effort unremarkable. No wheezes or rhonchi.   Breast: Symmetrical. No masses or lumps. No nipple discharge.  No abnormal axillary nodes.   Gastrointestinal: Abdomen soft and non-tender. No organomegaly or masses.   Genitourinary: deferred  Musculoskeletal: Gait and station unremarkable. Normal ROM. Normal strength and tone.   Extremities :No edema.  Peripheral pulses normal  Integumentary: Warm and dry.No rashes.  No abnormal appearing skin lesions  LYMPH NODES: Cervical, supraclavicular, and axillary nodes normal  Neurologic: Alert and oriented to place and time. Normal deep tendon reflexes. CN II-XII grossly intact.   Psychiatric: Mood and affect appropriate for situation.     Gabriel Rainwater, PA

## 2019-12-20 ENCOUNTER — Institutional Professional Consult (permissible substitution): Admit: 2019-12-20 | Discharge: 2019-12-21 | Payer: PRIVATE HEALTH INSURANCE

## 2019-12-20 DIAGNOSIS — Z23 Encounter for immunization: Principal | ICD-10-CM

## 2019-12-20 NOTE — Unmapped (Signed)
Asked immunization questions and explained risk and benefits of vaccine administration.     Administered 0.42ml of ordered Shingles #1 vaccine to right deltoid.    Pt tolerated vaccine well without immediate adverse effects.    VIS provided to pt.

## 2019-12-27 MED FILL — SERTRALINE 50 MG TABLET: 90 days supply | Qty: 270 | Fill #0 | Status: AC

## 2020-01-05 MED FILL — PANTOPRAZOLE 20 MG TABLET,DELAYED RELEASE: 90 days supply | Qty: 90 | Fill #0 | Status: AC

## 2020-01-24 DIAGNOSIS — M3130 Wegener's granulomatosis without renal involvement: Principal | ICD-10-CM

## 2020-01-24 MED ORDER — METHOTREXATE SODIUM 2.5 MG TABLET
ORAL_TABLET | ORAL | 3 refills | 84.00000 days | Status: CP
Start: 2020-01-24 — End: ?
  Filled 2020-01-28: qty 96, 84d supply, fill #0

## 2020-01-28 MED FILL — METHOTREXATE SODIUM 2.5 MG TABLET: 84 days supply | Qty: 96 | Fill #0 | Status: AC

## 2020-02-18 MED FILL — ERGOCALCIFEROL (VITAMIN D2) 1,250 MCG (50,000 UNIT) CAPSULE: 84 days supply | Qty: 12 | Fill #0 | Status: AC

## 2020-02-21 ENCOUNTER — Institutional Professional Consult (permissible substitution): Admit: 2020-02-21 | Discharge: 2020-02-22 | Payer: PRIVATE HEALTH INSURANCE

## 2020-02-21 DIAGNOSIS — Z23 Encounter for immunization: Principal | ICD-10-CM

## 2020-02-21 NOTE — Unmapped (Signed)
Vaccine administration note    Explained risks and benefits of vaccine administration.  VIS provided to patient for vaccine education.     Pt did not have any questions or concerns prior to administration.     Administered ordered vaccine Vaccine list: Zoster - Shingrix     Pt tolerated the vaccine without any immediate adverse effects.     Educated pt about the common side effects of the vaccine like redness, swelling and pain at the injection site.    Advised pt to call the office if side effects get worse or doesn't get better.

## 2020-03-24 DIAGNOSIS — M3130 Wegener's granulomatosis without renal involvement: Principal | ICD-10-CM

## 2020-03-24 MED ORDER — FOLIC ACID 1 MG TABLET
ORAL_TABLET | Freq: Every day | ORAL | 3 refills | 90.00000 days | Status: CP
Start: 2020-03-24 — End: ?

## 2020-03-24 MED FILL — MONTELUKAST 10 MG TABLET: ORAL | 90 days supply | Qty: 90 | Fill #1

## 2020-03-24 MED FILL — PANTOPRAZOLE 20 MG TABLET,DELAYED RELEASE: 90 days supply | Qty: 90 | Fill #1 | Status: AC

## 2020-03-24 MED FILL — PANTOPRAZOLE 20 MG TABLET,DELAYED RELEASE: ORAL | 90 days supply | Qty: 90 | Fill #1

## 2020-03-24 MED FILL — METHOTREXATE SODIUM 2.5 MG TABLET: 84 days supply | Qty: 96 | Fill #1 | Status: AC

## 2020-03-24 MED FILL — METHOTREXATE SODIUM 2.5 MG TABLET: ORAL | 84 days supply | Qty: 96 | Fill #1

## 2020-03-24 MED FILL — MONTELUKAST 10 MG TABLET: 90 days supply | Qty: 90 | Fill #1 | Status: AC

## 2020-04-17 ENCOUNTER — Institutional Professional Consult (permissible substitution): Admit: 2020-04-17 | Discharge: 2020-04-18 | Payer: PRIVATE HEALTH INSURANCE

## 2020-04-17 MED ORDER — HYDROXYZINE HCL 25 MG TABLET
ORAL_TABLET | 1 refills | 0 days | Status: CP
Start: 2020-04-17 — End: ?

## 2020-04-17 MED ORDER — BUPROPION HCL XL 150 MG 24 HR TABLET, EXTENDED RELEASE
ORAL_TABLET | Freq: Every morning | ORAL | 1 refills | 30.00000 days | Status: CP
Start: 2020-04-17 — End: 2021-04-17

## 2020-04-17 NOTE — Unmapped (Signed)
Called and spoke with pt. Pt will do a telephone visit with Anya today Per Anya.

## 2020-04-17 NOTE — Unmapped (Signed)
Can you schedule her on Wednesday or you can double book her for a video visit at 9:20 or 2:20 any day

## 2020-04-17 NOTE — Unmapped (Signed)
TeleHealth Phone Encounter  This medical encounter was conducted virtually using [email or web address]  Tele[facility] protocols.    Patient ID: Alexis Jenkins is a 49 y.o. female who presents by telephone interaction for evaluation of increasing anxiety.     Present on Telephone Call: Paula Libra    Assessment/Plan:      Diagnoses and all orders for this visit:    Anxiety/Mild episode of recurrent major depressive disorder (CMS-HCC).  Worsening anxiety and depression with patient's mom recently diagnosed with stage IV pancreatic cancer.  Patient is currently on Zoloft 150 mg daily.  She has been unable to tolerate benzodiazepines previously with increased agitation and feeling on edge.  Recommend adding in hydroxyzine prn situational anxiety.  Discussed possible side effect of fatigue and recommend she take it for the first time at night. Counseled patient to start 1/2 pill daily to see how she reacts to medication and then she can use 1/2-1 pill prn situational anxiety.  Instructed patient she can take up to 3 pills daily as needed. Will also add in Wellbutrin 150 mg XL daily to help with depression and energy.  Recommend Wellbutrin 150 mg XL to help with overall stress, mood.  Patient counseled on energizing effect and to take in the morning. Routine precautions given. Patient verbalized an understanding of these instructions and had no further questions.  Patient to followup one month for recheck on anxiety and depression.  Patient agrees.   -     hydrOXYzine (ATARAX) 25 MG tablet; Take 1/2 - 1 tablet tid prn  -     buPROPion (WELLBUTRIN XL) 150 MG 24 hr tablet; Take 1 tablet (150 mg total) by mouth every morning.    Preventive services addressed today  We did not review preventive services today    -- Discussed the new prescription noted above, including potential side effects, drug interactions, instructions for taking the medication, and the consequences of not taking it.  -- Patient verbalized an understanding of today's assessment and recommendations, as well as the purpose of ongoing medications.    Return in about 1 month (around 05/18/2020) for Recheck, Anxiety, Depression.    Visit Time: 12 minutes    Medication adherence and barriers to the treatment plan have been addressed. Opportunities to optimize healthy behaviors have been discussed. Patient / caregiver voiced understanding.     Subjective:     HPI  Alexis Jenkins is a 49 y.o. female who presents with increasing stress with her mom's recent diagnosis of stage IV pancreatic cancer with metastases to many areas. Patient states her mom will be coming home on hospice.  She states she has been crying nonstop. She just needs something to calm her down in the short term.  Xanax caused hypersensitivity and irritability. She is on Zoloft 150 mg daily.       ROS  As per HPI.    Outpatient Medications Prior to Visit   Medication Sig Dispense Refill   ??? albuterol HFA 90 mcg/actuation inhaler Inhale 2 puffs Every four (4) hours. 8.5 g 3   ??? coenzyme Q10 (CO Q-10) 10 mg capsule Take 10 mg by mouth daily.     ??? ergocalciferol-1,250 mcg, 50,000 unit, (DRISDOL) 1,250 mcg (50,000 unit) capsule Take 1 capsule (50,000 Units total) by mouth once a week. 12 capsule 3   ??? fish oil-omega-3 fatty acids 300-1,000 mg capsule Take 2 g by mouth daily.     ??? fluticasone propion-salmeteroL (ADVAIR DISKUS) 250-50 mcg/dose diskus Inhale 1  puff Two (2) times a day. 180 each 3   ??? folic acid (FOLVITE) 1 MG tablet Take 1 tablet (1 mg total) by mouth daily. 90 tablet 3   ??? glucosamine sulfate (GLUCOSAMINE) 500 mg Tab Take by mouth.     ??? methotrexate 2.5 MG tablet Take 8 tablets (20 mg total) by mouth every seven (7) days. 96 tablet 3   ??? montelukast (SINGULAIR) 10 mg tablet TAKE 1 TABLET BY MOUTH EVERY DAY 90 tablet 3   ??? pantoprazole (PROTONIX) 20 MG tablet Take 1 tablet (20 mg total) by mouth daily. 90 tablet 1   ??? sertraline (ZOLOFT) 50 MG tablet Take 3 tablets (150 mg total) by mouth daily. 270 tablet 3     No facility-administered medications prior to visit.       The following portions of the patient's history were reviewed and updated as appropriate: allergies, current medications, past medical history, past social history and problem list.        Objective:     As part of this Telephone Visit, no in-person exam was conducted.  Telephone interaction permitted the following observations.    General: Conversed easily on telephone with good verbal feedback confirming comprehension.  RESP: relaxed respiratory effort  PSYCH: Alert and oriented.  Speech fluent and sensible.  Calm affect.         Note - This chart has been prepared using the Dragon voice recognition system. Typographical errors may have occurred.  Attempts have been made to correct errors, however, inadvertent errors may persist.         I spent 12 minutes on the phone with the patient on the date of service.     The patient was physically located in [place] or a state in which I am permitted to provide care. The patient and/or parent/guardian understood that s/he may incur co-pays and cost sharing, and agreed to the telemedicine visit. The visit was reasonable and appropriate under the circumstances given the patient's presentation at the time.    The patient and/or parent/guardian has been advised of the potential risks and limitations of this mode of treatment (including, but not limited to, the absence of in-person examination) and has agreed to be treated using telemedicine. The patient's/patient's family's questions regarding telemedicine have been answered.     If the visit was completed in an ambulatory setting, the patient and/or parent/guardian has also been advised to contact their provider???s office for worsening conditions, and seek emergency medical treatment and/or call 911 if the patient deems either necessary.    I attest that I, Geralyn Mora Appl, personally documented this note while acting as scribe for [employer], [employer].      Geralyn A Mazzotta, Scribe.  04/17/2020     The documentation recorded by the scribe accurately reflects the service I personally performed and the decisions made by me.    Gabriel Rainwater, PA

## 2020-04-17 NOTE — Unmapped (Addendum)
Patient Education      RECOMMEND HYDROXYZINE 25 MG AS NEEDED FOR WORSENING ANXIETY.  TAKE 1/2 TO 1 PILL UP TO 3X A DAY.     WILL ADD IN WELLBUTRIN 150 MG XL DAILY TO ADD TO YOUR ZOLOFT.  TAKE FIRST THING IN THE MORNING.       Adjustment Disorder: Care Instructions  Your Care Instructions     Adjustment disorder means that you have emotional or behavioral problems because of stress. But your response to the stress is far more severe than a normal response. It is severe enough to affect your work or social life and may cause depression and physical pains and problems. Events that may cause this response can include a divorce, money problems, or starting school or a new job. It might be anything that causes some stress.  This disorder is most often a short-term problem. It happens within 3 months of the stressful event or change. If the response lasts longer than 6 months after the event ends, you may have a more serious disorder.  Follow-up care is a key part of your treatment and safety. Be sure to make and go to all appointments, and call your doctor if you are having problems. It's also a good idea to know your test results and keep a list of the medicines you take.  How can you care for yourself at home?  ?? Go to all counseling sessions. Do not skip any because you are feeling better.  ?? If your doctor prescribed medicines, take them exactly as prescribed. Call your doctor if you think you are having a problem with your medicine. You will get more details on the specific medicines your doctor prescribes.  ?? Discuss the causes of your stress with a good friend or family member. Or you can join a support group for people with similar problems. Talking to others sometimes relieves stress.  ?? Get at least 30 minutes of exercise on most days of the week. Walking is a good choice. You also may want to do other activities, such as running, swimming, cycling, or playing tennis or team sports.  Relaxation techniques  Do relaxation exercises 10 to 20 minutes a day. You can play soothing, relaxing music while you do them, if you wish.  ?? Tell others in your house that you are going to do your relaxation exercises. Ask them not to disturb you.  ?? Find a comfortable, quiet place.  ?? Lie down on your back, or sit with your back straight.  ?? Focus on your breathing. Make it slow and steady.  ?? Breathe in through your nose. Breathe out through either your nose or mouth.  ?? Breathe deeply, filling up the area between your navel and your rib cage. Breathe so that your belly goes up and down.  ?? Do not hold your breath.  ?? Breathe like this for 5 to 10 minutes. Notice the feeling of calmness throughout your whole body.  As you continue to breathe slowly and deeply, relax by doing these next steps for another 5 to 10 minutes:  ?? Tighten and relax each muscle group in your body. Start at your toes, and work your way up to your head.  ?? Imagine your muscle groups relaxing and getting heavy.  ?? Empty your mind of all thoughts.  ?? Let yourself relax more and more deeply.  ?? Be aware of the state of calmness that surrounds you.  ?? When your relaxation time is  over, you can bring yourself back to alertness by moving your fingers and toes. Then move your hands and feet. And then move your entire body. Sometimes people fall asleep during relaxation. But they most often wake up soon.  ?? Always give yourself time to return to full alertness before you drive a car. Wait to do anything that might cause an accident if you are not fully alert. Never play a relaxation tape while you drive a car.  When should you call for help?   Call 911 anytime you think you may need emergency care. For example, call if:  ?? ?? You feel you cannot stop from hurting yourself or someone else. Keep the numbers for these national suicide hotlines: 1-800-273-TALK (646)541-6256) and 1-800-SUICIDE 202-224-9380). If you or someone you know talks about suicide or feeling hopeless, get help right away.   Watch closely for changes in your health, and be sure to contact your doctor if:  ?? ?? You have new anxiety, or your anxiety gets worse.   ?? ?? You have been feeling sad, depressed, or hopeless or have lost interest in things that you usually enjoy.   ?? ?? You do not get better as expected.   Where can you learn more?  Go to [facility] at https://myuncchart.org  Select Patient Education under [employer]. Enter R087 in the search box to learn more about Adjustment Disorder: Care Instructions.  Current as of: December 15, 2019??????????????????????????????Content Version: 13.0  ?? 2006-2021 [employer].   Care instructions adapted under license by [facility]. If you have questions about a medical condition or this instruction, always ask your healthcare professional. [employer] disclaims any warranty or liability for your use of this information.

## 2020-06-07 NOTE — Unmapped (Signed)
Open colonoscopy referral

## 2021-01-04 DIAGNOSIS — F330 Major depressive disorder, recurrent, mild: Principal | ICD-10-CM

## 2021-01-04 MED ORDER — SERTRALINE 50 MG TABLET
ORAL_TABLET | Freq: Every day | ORAL | 3 refills | 90 days
Start: 2021-01-04 — End: ?

## 2021-01-05 DIAGNOSIS — F330 Major depressive disorder, recurrent, mild: Principal | ICD-10-CM

## 2021-01-05 MED ORDER — SERTRALINE 50 MG TABLET
ORAL_TABLET | Freq: Every day | ORAL | 2 refills | 0.00000 days | Status: CP
Start: 2021-01-05 — End: ?

## 2021-01-05 NOTE — Unmapped (Signed)
Patient is requesting the following refill  Requested Prescriptions     Pending Prescriptions Disp Refills   ??? sertraline (ZOLOFT) 50 MG tablet 270 tablet 3     Sig: Take 3 tablets (150 mg total) by mouth in the morning.       Last refill given on:06/18/22with 270 count and 3 refills.     Last OV: 12/17/19 [facility] ON 12/14/21Next OV: NA

## 2021-01-05 NOTE — Unmapped (Signed)
mychart message sent to schedule an appt.

## 2021-03-06 DIAGNOSIS — J4520 Mild intermittent asthma, uncomplicated: Principal | ICD-10-CM

## 2021-03-06 MED ORDER — MONTELUKAST 10 MG TABLET
ORAL_TABLET | 0 refills | 0 days
Start: 2021-03-06 — End: ?

## 2022-12-20 IMAGING — MG MM BREAST SURGICAL SPECIMEN
1 series · 2 of 2 positions shown · non-contrast
Comparison: Prior studies

CLINICAL DATA: Status post seed localized LEFT lumpectomy.

EXAM:
SPECIMEN RADIOGRAPH OF THE LEFT BREAST

[Series 1: L · left · 0.07mm/px · 2 of 2 slices shown]
[im 1/2]
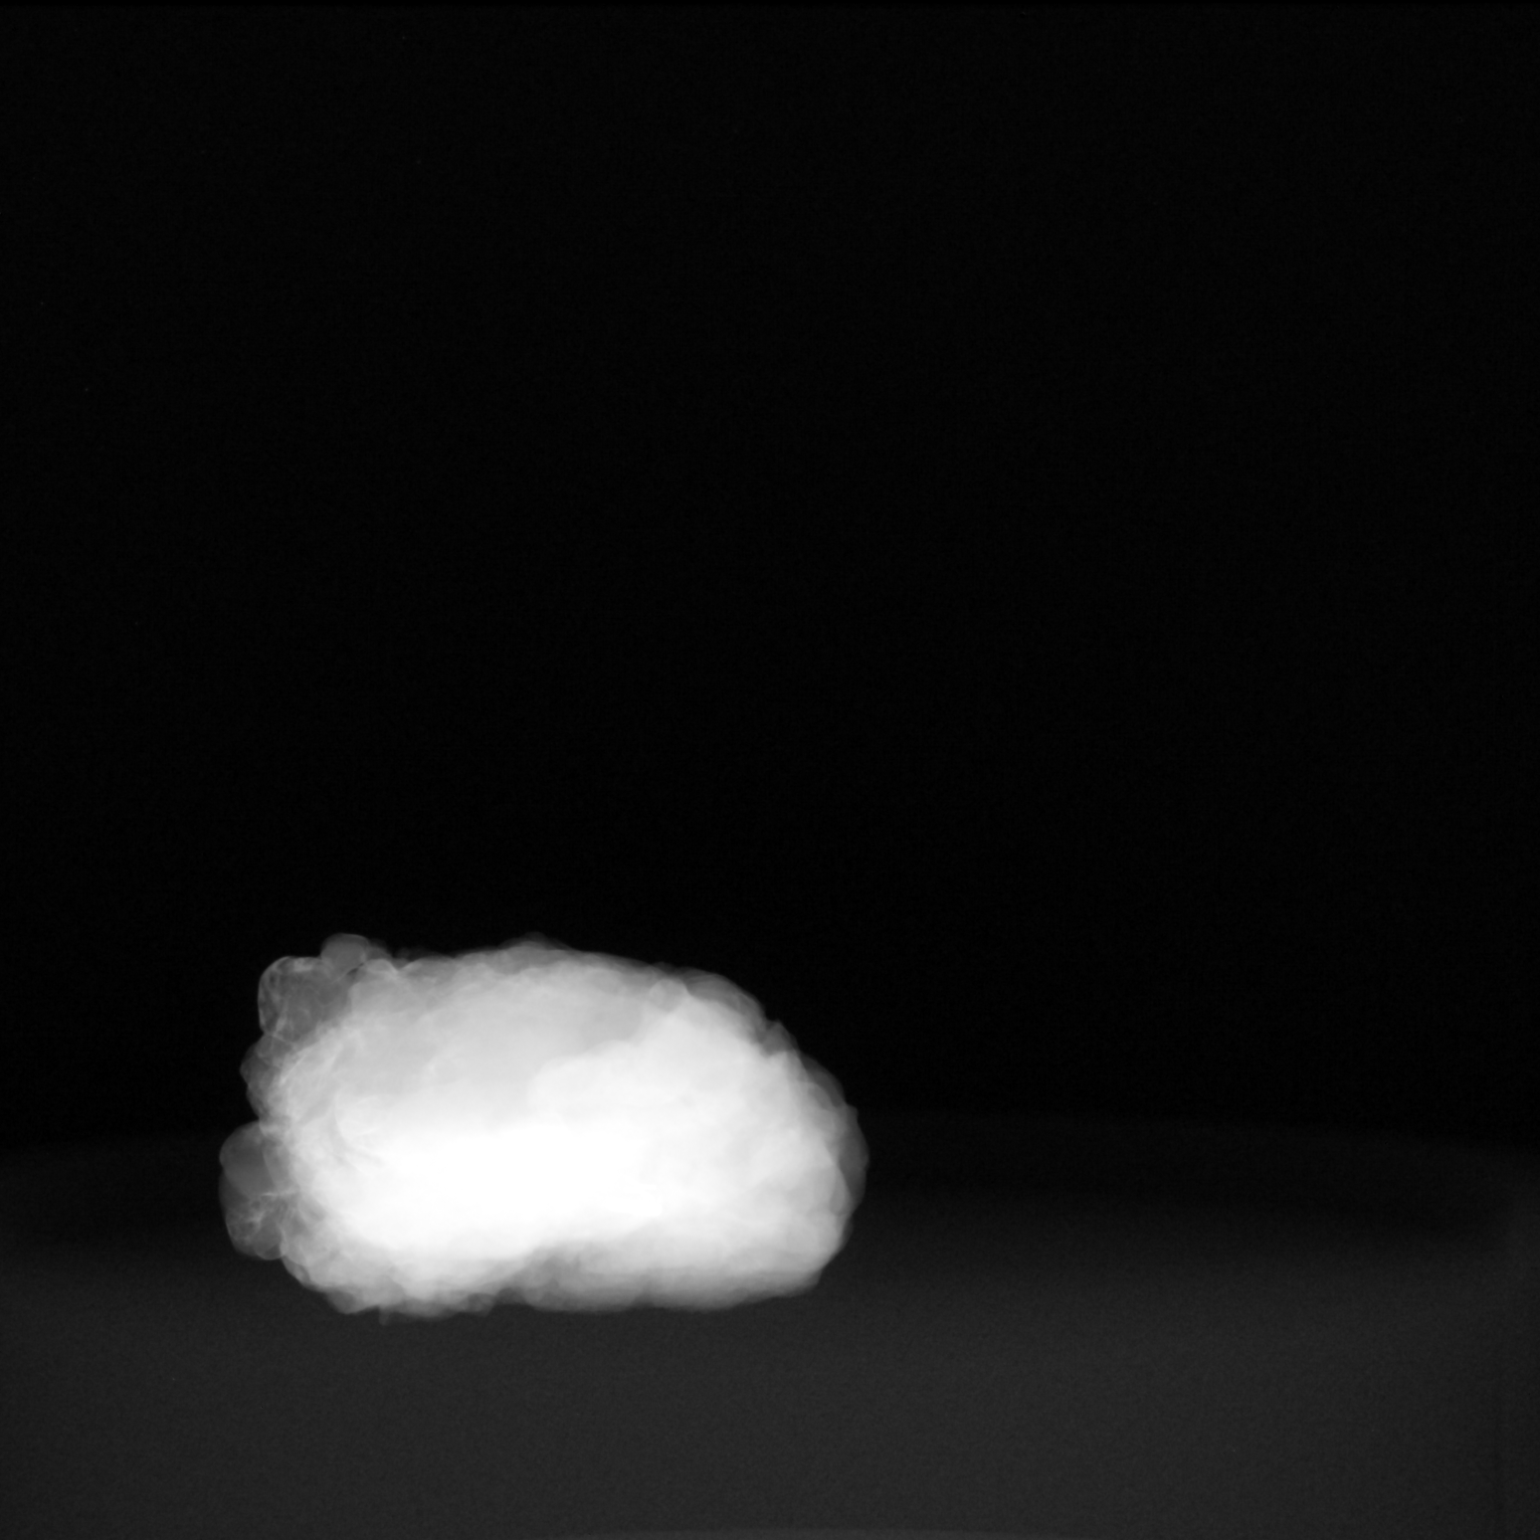
[im 2/2]
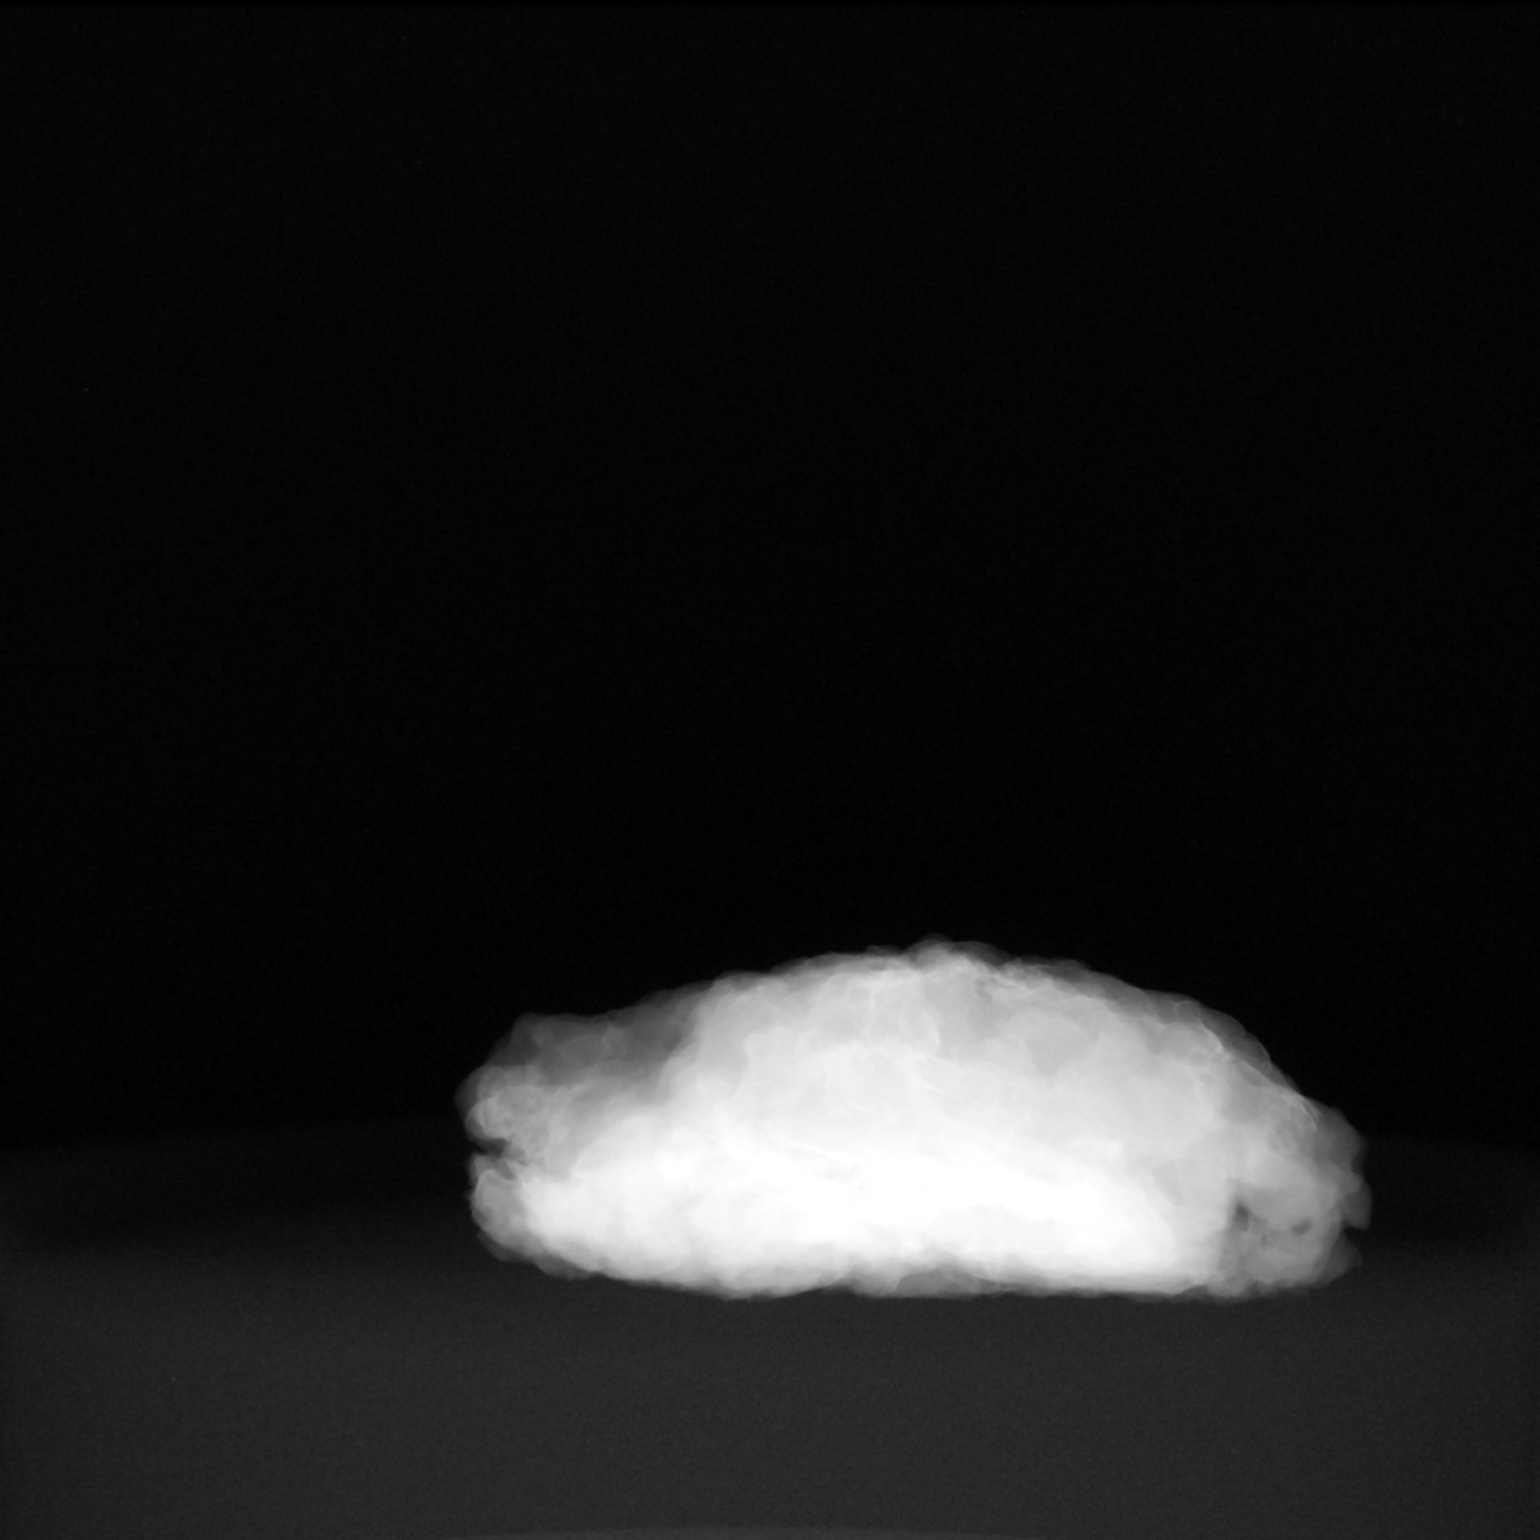

[2 of 2 positions shown; findings below may reference images not displayed]

FINDINGS: Status post excision of the LEFT breast. The radioactive seed and X
shaped biopsy marker clip are present and intact. The residual
calcifications and are not clearly seen, possibly related to the
technique/resolution. Findings are discussed with Dr. Pomeroy in the
operating room.
IMPRESSION: Specimen radiograph of the left breast.
